# Patient Record
Sex: Male | Born: 1935 | Race: Black or African American | Hispanic: No | Marital: Married | Smoking: Former smoker
Health system: Southern US, Community
[De-identification: ages and names within clinical notes are randomized; demographics above are authoritative.]

## PROBLEM LIST (undated history)

## (undated) DIAGNOSIS — C61 Malignant neoplasm of prostate: Secondary | ICD-10-CM

## (undated) DIAGNOSIS — I719 Aortic aneurysm of unspecified site, without rupture: Secondary | ICD-10-CM

## (undated) DIAGNOSIS — E119 Type 2 diabetes mellitus without complications: Secondary | ICD-10-CM

## (undated) DIAGNOSIS — I509 Heart failure, unspecified: Secondary | ICD-10-CM

## (undated) DIAGNOSIS — N289 Disorder of kidney and ureter, unspecified: Secondary | ICD-10-CM

## (undated) DIAGNOSIS — C3490 Malignant neoplasm of unspecified part of unspecified bronchus or lung: Secondary | ICD-10-CM

## (undated) DIAGNOSIS — J8410 Pulmonary fibrosis, unspecified: Secondary | ICD-10-CM

## (undated) DIAGNOSIS — I10 Essential (primary) hypertension: Secondary | ICD-10-CM

## (undated) DIAGNOSIS — J449 Chronic obstructive pulmonary disease, unspecified: Secondary | ICD-10-CM

## (undated) HISTORY — PX: BACK SURGERY: SHX140

## (undated) HISTORY — PX: PROSTATE SURGERY: SHX751

---

## 2006-07-30 ENCOUNTER — Ambulatory Visit: Payer: Self-pay | Admitting: Urology

## 2006-08-29 ENCOUNTER — Encounter: Payer: Self-pay | Admitting: Urology

## 2006-08-31 ENCOUNTER — Encounter: Payer: Self-pay | Admitting: Urology

## 2006-10-01 ENCOUNTER — Encounter: Payer: Self-pay | Admitting: Urology

## 2006-11-01 ENCOUNTER — Encounter: Payer: Self-pay | Admitting: Urology

## 2006-11-30 ENCOUNTER — Encounter: Payer: Self-pay | Admitting: Urology

## 2008-07-01 ENCOUNTER — Ambulatory Visit: Payer: Self-pay | Admitting: Oncology

## 2008-07-05 ENCOUNTER — Ambulatory Visit: Payer: Self-pay | Admitting: Oncology

## 2008-08-01 ENCOUNTER — Ambulatory Visit: Payer: Self-pay | Admitting: Oncology

## 2008-10-01 ENCOUNTER — Ambulatory Visit: Payer: Self-pay | Admitting: Oncology

## 2008-10-28 ENCOUNTER — Ambulatory Visit: Payer: Self-pay | Admitting: Oncology

## 2008-11-01 ENCOUNTER — Ambulatory Visit: Payer: Self-pay | Admitting: Oncology

## 2009-01-29 ENCOUNTER — Ambulatory Visit: Payer: Self-pay | Admitting: Oncology

## 2009-02-24 ENCOUNTER — Ambulatory Visit: Payer: Self-pay | Admitting: Oncology

## 2009-03-01 ENCOUNTER — Ambulatory Visit: Payer: Self-pay | Admitting: Oncology

## 2009-07-01 ENCOUNTER — Ambulatory Visit: Payer: Self-pay | Admitting: Oncology

## 2009-07-07 ENCOUNTER — Ambulatory Visit: Payer: Self-pay | Admitting: Oncology

## 2009-08-01 ENCOUNTER — Ambulatory Visit: Payer: Self-pay | Admitting: Oncology

## 2009-10-01 ENCOUNTER — Ambulatory Visit: Payer: Self-pay | Admitting: Oncology

## 2009-10-25 ENCOUNTER — Ambulatory Visit: Payer: Self-pay | Admitting: Oncology

## 2009-11-01 ENCOUNTER — Ambulatory Visit: Payer: Self-pay | Admitting: Oncology

## 2009-11-29 ENCOUNTER — Ambulatory Visit: Payer: Self-pay | Admitting: Oncology

## 2010-02-21 ENCOUNTER — Ambulatory Visit: Payer: Self-pay | Admitting: Oncology

## 2010-03-01 ENCOUNTER — Ambulatory Visit: Payer: Self-pay | Admitting: Oncology

## 2010-06-01 ENCOUNTER — Ambulatory Visit: Payer: Self-pay | Admitting: Oncology

## 2010-06-22 ENCOUNTER — Ambulatory Visit: Payer: Self-pay | Admitting: Oncology

## 2010-07-01 ENCOUNTER — Ambulatory Visit: Payer: Self-pay | Admitting: Oncology

## 2010-10-12 ENCOUNTER — Ambulatory Visit: Payer: Self-pay | Admitting: Oncology

## 2010-10-13 LAB — PSA

## 2010-11-01 ENCOUNTER — Ambulatory Visit: Payer: Self-pay | Admitting: Oncology

## 2011-03-28 ENCOUNTER — Ambulatory Visit: Payer: Self-pay | Admitting: Oncology

## 2011-04-01 ENCOUNTER — Ambulatory Visit: Payer: Self-pay | Admitting: Oncology

## 2011-07-27 ENCOUNTER — Ambulatory Visit: Payer: Self-pay | Admitting: Oncology

## 2011-08-02 ENCOUNTER — Ambulatory Visit: Payer: Self-pay | Admitting: Oncology

## 2011-09-01 ENCOUNTER — Ambulatory Visit: Payer: Self-pay | Admitting: Oncology

## 2012-01-31 ENCOUNTER — Ambulatory Visit: Payer: Self-pay | Admitting: Oncology

## 2012-01-31 LAB — BASIC METABOLIC PANEL
Anion Gap: 10 (ref 7–16)
Calcium, Total: 9.4 mg/dL (ref 8.5–10.1)
Chloride: 101 mmol/L (ref 98–107)
EGFR (African American): 57 — ABNORMAL LOW

## 2012-03-01 ENCOUNTER — Ambulatory Visit: Payer: Self-pay | Admitting: Oncology

## 2012-06-20 ENCOUNTER — Ambulatory Visit: Payer: Self-pay | Admitting: Oncology

## 2012-06-20 LAB — CREATININE, SERUM
Creatinine: 1.42 mg/dL — ABNORMAL HIGH (ref 0.60–1.30)
EGFR (Non-African Amer.): 48 — ABNORMAL LOW

## 2012-06-20 LAB — BUN: BUN: 14 mg/dL (ref 7–18)

## 2012-07-01 ENCOUNTER — Ambulatory Visit: Payer: Self-pay | Admitting: Oncology

## 2012-10-01 ENCOUNTER — Ambulatory Visit: Payer: Self-pay | Admitting: Hematology and Oncology

## 2012-10-24 LAB — BASIC METABOLIC PANEL
BUN: 17 mg/dL (ref 7–18)
Calcium, Total: 9 mg/dL (ref 8.5–10.1)
Chloride: 104 mmol/L (ref 98–107)
EGFR (Non-African Amer.): 45 — ABNORMAL LOW
Glucose: 81 mg/dL (ref 65–99)
Osmolality: 282 (ref 275–301)
Sodium: 141 mmol/L (ref 136–145)

## 2012-11-01 ENCOUNTER — Ambulatory Visit: Payer: Self-pay | Admitting: Hematology and Oncology

## 2012-11-29 ENCOUNTER — Ambulatory Visit: Payer: Self-pay | Admitting: Hematology and Oncology

## 2012-12-12 LAB — CBC CANCER CENTER
Basophil #: 0.1 x10 3/mm (ref 0.0–0.1)
Basophil %: 1.1 %
Eosinophil #: 0.3 x10 3/mm (ref 0.0–0.7)
HGB: 13.3 g/dL (ref 13.0–18.0)
Lymphocyte #: 1.6 x10 3/mm (ref 1.0–3.6)
Lymphocyte %: 24 %
MCH: 29.7 pg (ref 26.0–34.0)
Monocyte #: 0.4 x10 3/mm (ref 0.2–1.0)
Neutrophil #: 4.4 x10 3/mm (ref 1.4–6.5)
Neutrophil %: 65.3 %
RBC: 4.47 10*6/uL (ref 4.40–5.90)
RDW: 15.4 % — ABNORMAL HIGH (ref 11.5–14.5)
WBC: 6.8 x10 3/mm (ref 3.8–10.6)

## 2012-12-19 LAB — CBC CANCER CENTER
Basophil #: 0.1 x10 3/mm (ref 0.0–0.1)
Basophil %: 0.6 %
Eosinophil #: 0.3 x10 3/mm (ref 0.0–0.7)
Eosinophil %: 3.7 %
HGB: 13.7 g/dL (ref 13.0–18.0)
Lymphocyte #: 1.5 x10 3/mm (ref 1.0–3.6)
Lymphocyte %: 18.6 %
MCHC: 33.4 g/dL (ref 32.0–36.0)
MCV: 89 fL (ref 80–100)
Monocyte #: 0.5 x10 3/mm (ref 0.2–1.0)
Monocyte %: 6.7 %
Platelet: 120 x10 3/mm — ABNORMAL LOW (ref 150–440)
RBC: 4.64 10*6/uL (ref 4.40–5.90)

## 2012-12-26 LAB — CBC CANCER CENTER
Basophil #: 0.1 x10 3/mm (ref 0.0–0.1)
Basophil %: 0.9 %
Eosinophil #: 0.3 x10 3/mm (ref 0.0–0.7)
Eosinophil %: 4.2 %
HCT: 41.2 % (ref 40.0–52.0)
HGB: 13.7 g/dL (ref 13.0–18.0)
Lymphocyte #: 1.3 x10 3/mm (ref 1.0–3.6)
Lymphocyte %: 18.2 %
MCH: 29.4 pg (ref 26.0–34.0)
MCV: 89 fL (ref 80–100)
Monocyte %: 7.6 %
Neutrophil %: 69.1 %
Platelet: 126 x10 3/mm — ABNORMAL LOW (ref 150–440)
RBC: 4.66 10*6/uL (ref 4.40–5.90)
RDW: 15.7 % — ABNORMAL HIGH (ref 11.5–14.5)
WBC: 7.4 x10 3/mm (ref 3.8–10.6)

## 2012-12-30 ENCOUNTER — Ambulatory Visit: Payer: Self-pay | Admitting: Hematology and Oncology

## 2013-01-02 LAB — CBC CANCER CENTER
Basophil %: 0.6 %
Eosinophil #: 0.2 x10 3/mm (ref 0.0–0.7)
Eosinophil %: 2.7 %
HGB: 12.5 g/dL — ABNORMAL LOW (ref 13.0–18.0)
Lymphocyte #: 0.9 x10 3/mm — ABNORMAL LOW (ref 1.0–3.6)
Lymphocyte %: 12.2 %
MCHC: 32.8 g/dL (ref 32.0–36.0)
Monocyte %: 8.2 %
Neutrophil %: 76.3 %
Platelet: 139 x10 3/mm — ABNORMAL LOW (ref 150–440)
RBC: 4.29 10*6/uL — ABNORMAL LOW (ref 4.40–5.90)
RDW: 16 % — ABNORMAL HIGH (ref 11.5–14.5)

## 2013-01-09 LAB — CBC CANCER CENTER
Basophil #: 0.1 x10 3/mm (ref 0.0–0.1)
Basophil %: 0.8 %
Eosinophil %: 4.1 %
HCT: 38.1 % — ABNORMAL LOW (ref 40.0–52.0)
Lymphocyte #: 1 x10 3/mm (ref 1.0–3.6)
MCH: 29.3 pg (ref 26.0–34.0)
Monocyte %: 9.4 %
Neutrophil #: 4.7 x10 3/mm (ref 1.4–6.5)
Neutrophil %: 70.2 %
Platelet: 164 x10 3/mm (ref 150–440)
RBC: 4.31 10*6/uL — ABNORMAL LOW (ref 4.40–5.90)
RDW: 15.9 % — ABNORMAL HIGH (ref 11.5–14.5)

## 2013-01-23 LAB — CBC CANCER CENTER
Basophil #: 0.1 x10 3/mm (ref 0.0–0.1)
HCT: 39.2 % — ABNORMAL LOW (ref 40.0–52.0)
HGB: 13.2 g/dL (ref 13.0–18.0)
Lymphocyte %: 13.1 %
MCH: 29.2 pg (ref 26.0–34.0)
MCHC: 33.6 g/dL (ref 32.0–36.0)
MCV: 87 fL (ref 80–100)
Monocyte %: 9 %
Platelet: 164 x10 3/mm (ref 150–440)
RBC: 4.5 10*6/uL (ref 4.40–5.90)

## 2013-01-29 ENCOUNTER — Ambulatory Visit: Payer: Self-pay | Admitting: Hematology and Oncology

## 2013-03-03 ENCOUNTER — Ambulatory Visit: Payer: Self-pay | Admitting: Oncology

## 2013-03-13 LAB — CBC
HCT: 36 % — ABNORMAL LOW (ref 40.0–52.0)
HGB: 12.2 g/dL — ABNORMAL LOW (ref 13.0–18.0)
MCHC: 33.8 g/dL (ref 32.0–36.0)
MCV: 85 fL (ref 80–100)
Platelet: 200 10*3/uL (ref 150–440)
RDW: 15.2 % — ABNORMAL HIGH (ref 11.5–14.5)
WBC: 7.4 10*3/uL (ref 3.8–10.6)

## 2013-03-13 LAB — BASIC METABOLIC PANEL
Anion Gap: 7 (ref 7–16)
BUN: 15 mg/dL (ref 7–18)
Calcium, Total: 8.7 mg/dL (ref 8.5–10.1)
Creatinine: 1.29 mg/dL (ref 0.60–1.30)
EGFR (Non-African Amer.): 54 — ABNORMAL LOW

## 2013-03-13 LAB — TROPONIN I: Troponin-I: <0.02 ng/mL

## 2013-03-14 ENCOUNTER — Inpatient Hospital Stay: Payer: Self-pay | Admitting: Internal Medicine

## 2013-03-14 LAB — URINALYSIS, COMPLETE
Bilirubin,UR: NEGATIVE
Leukocyte Esterase: NEGATIVE
Ph: 6 (ref 4.5–8.0)
RBC,UR: 1 /HPF (ref 0–5)
Specific Gravity: 1.005 (ref 1.003–1.030)
Squamous Epithelial: NONE SEEN
WBC UR: 1 /HPF (ref 0–5)

## 2013-03-14 LAB — CK TOTAL AND CKMB (NOT AT ARMC)
CK, Total: 60 U/L (ref 35–232)
CK-MB: 0.6 ng/mL (ref 0.5–3.6)
CK-MB: 0.8 ng/mL (ref 0.5–3.6)

## 2013-03-14 LAB — TROPONIN I: Troponin-I: <0.02 ng/mL

## 2013-03-15 LAB — BASIC METABOLIC PANEL
Anion Gap: 10 (ref 7–16)
Chloride: 102 mmol/L (ref 98–107)
Co2: 25 mmol/L (ref 21–32)
Creatinine: 1.28 mg/dL (ref 0.60–1.30)
EGFR (African American): >60
EGFR (Non-African Amer.): 55 — ABNORMAL LOW
Osmolality: 288 (ref 275–301)
Potassium: 3.7 mmol/L (ref 3.5–5.1)
Sodium: 137 mmol/L (ref 136–145)

## 2013-03-15 LAB — LIPID PANEL
HDL Cholesterol: 55 mg/dL (ref 40–60)
Ldl Cholesterol, Calc: 125 mg/dL — ABNORMAL HIGH (ref 0–100)

## 2013-03-15 LAB — TSH: Thyroid Stimulating Horm: 0.199 u[IU]/mL — ABNORMAL LOW

## 2013-03-15 LAB — MAGNESIUM: Magnesium: 2.1 mg/dL

## 2013-03-15 LAB — URINALYSIS, COMPLETE
Bacteria: NONE SEEN
Bilirubin,UR: NEGATIVE
Glucose,UR: NEGATIVE mg/dL (ref 0–75)
Leukocyte Esterase: NEGATIVE
Ph: 6 (ref 4.5–8.0)
Protein: NEGATIVE
Specific Gravity: 1.012 (ref 1.003–1.030)
WBC UR: 1 /HPF (ref 0–5)

## 2013-03-16 LAB — BASIC METABOLIC PANEL
BUN: 24 mg/dL — ABNORMAL HIGH (ref 7–18)
Chloride: 102 mmol/L (ref 98–107)
Co2: 28 mmol/L (ref 21–32)
Creatinine: 1.13 mg/dL (ref 0.60–1.30)
Osmolality: 286 (ref 275–301)
Potassium: 3.6 mmol/L (ref 3.5–5.1)
Sodium: 137 mmol/L (ref 136–145)

## 2013-03-16 LAB — URINE CULTURE

## 2013-05-30 ENCOUNTER — Emergency Department: Payer: Self-pay | Admitting: Emergency Medicine

## 2013-05-30 LAB — URINALYSIS, COMPLETE
Bacteria: NONE SEEN
Bilirubin,UR: NEGATIVE
Glucose,UR: NEGATIVE mg/dL (ref 0–75)
Nitrite: NEGATIVE
Protein: 30
Specific Gravity: 1.058 (ref 1.003–1.030)
Squamous Epithelial: <1

## 2013-05-30 LAB — COMPREHENSIVE METABOLIC PANEL
Anion Gap: 7 (ref 7–16)
BUN: 18 mg/dL (ref 7–18)
Calcium, Total: 8.9 mg/dL (ref 8.5–10.1)
Co2: 26 mmol/L (ref 21–32)
EGFR (Non-African Amer.): 51 — ABNORMAL LOW
Osmolality: 281 (ref 275–301)
SGPT (ALT): 19 U/L (ref 12–78)
Total Protein: 8.3 g/dL — ABNORMAL HIGH (ref 6.4–8.2)

## 2013-05-30 LAB — CBC WITH DIFFERENTIAL/PLATELET
Basophil %: 0.8 %
Eosinophil #: 0.3 10*3/uL (ref 0.0–0.7)
HCT: 39.9 % — ABNORMAL LOW (ref 40.0–52.0)
HGB: 13.3 g/dL (ref 13.0–18.0)
MCH: 27.9 pg (ref 26.0–34.0)
Neutrophil %: 78.5 %
Platelet: 157 10*3/uL (ref 150–440)
RBC: 4.77 10*6/uL (ref 4.40–5.90)

## 2013-05-30 LAB — PRO B NATRIURETIC PEPTIDE: B-Type Natriuretic Peptide: 1429 pg/mL — ABNORMAL HIGH

## 2013-05-30 LAB — TROPONIN I: Troponin-I: <0.02 ng/mL

## 2013-05-30 LAB — LIPASE, BLOOD: Lipase: 76 U/L (ref 73–393)

## 2013-07-08 ENCOUNTER — Ambulatory Visit: Payer: Self-pay | Admitting: Oncology

## 2013-07-09 LAB — CBC CANCER CENTER
Basophil %: 1.2 %
Eosinophil #: 0.3 x10 3/mm (ref 0.0–0.7)
HCT: 40.3 % (ref 40.0–52.0)
HGB: 13.2 g/dL (ref 13.0–18.0)
Lymphocyte #: 1.4 x10 3/mm (ref 1.0–3.6)
Lymphocyte %: 16.5 %
MCH: 27.9 pg (ref 26.0–34.0)
MCHC: 32.7 g/dL (ref 32.0–36.0)
Monocyte %: 5.7 %
Neutrophil #: 5.9 x10 3/mm (ref 1.4–6.5)
Platelet: 172 x10 3/mm (ref 150–440)
RBC: 4.72 10*6/uL (ref 4.40–5.90)
WBC: 8.2 x10 3/mm (ref 3.8–10.6)

## 2013-07-09 LAB — BASIC METABOLIC PANEL
Anion Gap: 12 (ref 7–16)
Calcium, Total: 8.6 mg/dL (ref 8.5–10.1)
Chloride: 103 mmol/L (ref 98–107)
Co2: 27 mmol/L (ref 21–32)
Creatinine: 1.4 mg/dL — ABNORMAL HIGH (ref 0.60–1.30)
EGFR (Non-African Amer.): 49 — ABNORMAL LOW
Potassium: 3.5 mmol/L (ref 3.5–5.1)

## 2013-07-10 LAB — PSA: PSA: 0.1 ng/mL (ref 0.0–4.0)

## 2013-08-01 ENCOUNTER — Ambulatory Visit: Payer: Self-pay | Admitting: Oncology

## 2014-01-06 ENCOUNTER — Ambulatory Visit: Payer: Self-pay | Admitting: Radiation Oncology

## 2014-05-18 ENCOUNTER — Ambulatory Visit: Payer: Self-pay | Admitting: Oncology

## 2014-05-20 LAB — PSA: PSA: 0.3 ng/mL (ref 0.0–4.0)

## 2014-06-01 ENCOUNTER — Ambulatory Visit: Payer: Self-pay | Admitting: Oncology

## 2015-01-21 NOTE — Discharge Summary (Signed)
PATIENT NAME:  Gabriel Gregory, Gabriel Gregory MR#:  [id number] DATE OF BIRTH:  1936-08-13  DATE OF ADMISSION:  03/14/2013 DATE OF DISCHARGE:  03/16/2013  PRIMARY CARE PHYSICIAN: At [facility]:  1.  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  2.  Chronic obstructive pulmonary disease exacerbation.  3.  Tobacco abuse.  4.  Obesity and sleep apnea.  5.  Hypertension.  6.  Diabetes.  7.  Hyperlipidemia.   DISCHARGE MEDICATIONS: Include: Glipizide 5 mg once a day, metformin 1000 mg twice a day, B complex vitamin 1 tablet daily, potassium chloride 20 mEq 1/2 tablet daily, latanoprost ophthalmic solution 0.005% solution 1 drop right eye at bedtime, lisinopril 40 mg daily, furosemide 20 mg, 2 tablets in the a.m. and 1 tablet in the afternoon, timolol ophthalmic drop, 1 drop affected eye each day, metoprolol 50 mg twice a day, Zithromax 500 mg every 24 hours for 2 more doses, prednisone 5 mg 4 tablets day 1, 3 tablets day 2, 2 tablets day 3, 1 tablet days 4 and 5.    DIET: Low sodium, carbohydrate-controlled diet, regular consistency.   ACTIVITY: As tolerated.   FOLLOWUP: In 1 to 2 weeks at the [facility] in [place]: The patient was admitted 03/14/2013 and discharged 03/16/2013. Came in with cough and shortness of breath, was admitted with COPD exacerbation and heart failure, started on IV Lasix, IV steroids, Rocephin and Zithromax.   LABORATORY AND RADIOLOGICAL DATA: During the hospital course:  Included an EKG that showed sinus rhythm, occasional PVCs. Chest x-ray showed differential, could be pulmonary fibrosis, superimposed pulmonary edema; infectious or inflammatory infiltrate cannot be excluded. BNP 1128, glucose 110, BUN 15, creatinine 1.29, sodium 143, potassium 3.0, chloride 107, CO2 29, calcium 8.7. Liver function tests not done. White blood cell count 7.4, H and H 12.2 and 36.0, platelet count of 200.  Troponin negative. Urinalysis 1+ blood. Troponin remained negative. TSH 0.199, free T4 1.11, free T3 1.8, LDL 125, HDL 55, triglycerides 88.  CT scan of the chest without contrast showed atrophy of the left kidney, small bilateral pleural effusions with findings suggestive of interstitial edema superimposed on COPD.  May be some underlying chronic interstitial lung disease as well. Scattered areas of fibrosis. Urine culture negative. Creatinine upon discharge 1.13, potassium 3.6. Echocardiogram showed an EF of 40 to 45%, moderately dilated left atrium, moderately dilated right atrium, moderate mitral valve regurgitation, moderately elevated pulmonary arterial systolic pressure.   HOSPITAL COURSE PER PROBLEM LIST:  1.  Acute combined systolic and diastolic congestive heart failure with moderate mitral regurgitation: The patient was diuresed initially with Lasix 20 mg IV b.i.d. I did switch him over to oral Lasix 40 mg daily, but he was taking 40 at home, so I figured I would go 40 in the morning and 20 the evening orally. I kept his lisinopril at 40 mg.  I cut back his metoprolol to 50 mg twice a day. Blood pressure in the morning 125/75, upon discharge had one at 142/72. Can consider adding Aldactone or even hydralazine and Imdur as outpatient if this still remains a problem. Close clinical follow-up as outpatient needed. Daily weights and monitoring needed by patient.  This was explained in detail.  2.  Chronic obstructive pulmonary disease exacerbation:   The patient was given IV antibiotics and IV steroids. The patient was switched over to oral Zithromax upon discharge and a prednisone taper. I think the  major issue here is the patient is a smoker and smoking cessation needed.  3.  Tobacco abuse:  Smoking cessation counseling done 3 minutes by me. The patient would go cold [place] on smoking cessation.  4.  Obesity and sleep apnea: Must wear CPAP at night, weight loss needed.  5.  Hypertension:  Blood  pressure stable upon discharge.  6:  Diabetes:  Sugars high with the steroids, can restart the Glucophage as outpatient.  7.  Hyperlipidemia: The patient does have an allergy to the statin class with Zocor, so I did not prescribe any statins. Can consider as outpatient.         TIME SPENT ON DISCHARGE:  35 minutes.   ____________________________ Tana Conch. Leslye Peer, MD rjw:cb D: 03/16/2013 16:14:48 ET T: 03/16/2013 23:44:03 ET JOB#: [id number]  cc: Tana Conch. Leslye Peer, MD, <Dictator> [place] SIGNED 03/19/2013 13:14

## 2015-01-21 NOTE — H&P (Signed)
PATIENT NAME:  Gabriel Gregory, Gabriel Gregory MR#:  [id number] DATE OF BIRTH:  [birth year]  DATE OF ADMISSION:  03/14/2013  PRIMARY CARE PHYSICIAN: At [facility] PHYSICIAN: Pollie Friar, MD   CHIEF COMPLAINT: Cough, shortness of breath.   HISTORY OF PRESENT ILLNESS: Gabriel Gregory is a 79 year old African-American male with past medical history of hypertension, hyperlipidemia, diabetes mellitus, continued tobacco use, who presented to the Emergency Department with complaints of shortness of breath. The patient has been having cough with productive yellowish sputum. Has been experiencing subjective fevers. For the last 2 days, the patient was unable to lie down flat, sitting in the recliner, leaning forward. Has been having dyspnea with mild exertion. The patient was able to walk around the house without shortness of breath; however, this is a significant change from his baseline. Concerning this, the patient is brought to the Emergency Department. Initially, the patient was diffusely wheezing. Received multiple breathing treatments. The patient also noted to have increased swelling in the lower extremities. Experienced chest pain yesterday, substernal pressure-like pain, no radiation, associated with worsening of the shortness of breath. Workup in the Emergency Department: Troponin is negative. BNP is 1120.   PAST MEDICAL HISTORY:  1. Hypertension.  2. Hyperlipidemia.  3. Diabetes mellitus.  4. Continued tobacco use.  5. History of prostate cancer.  6. Debility.  ALLERGIES:  1. FELODIPINE.  2. SIMVASTATIN.   HOME MEDICATIONS:  1. Timolol ophthalmic drops once a day.  2. Rosuvastatin 20 mg half-tablet once a day at bedtime.  3. Metoprolol 100 mg 1 tablet 2 times a day.  4. Metformin 1000 mg 2 times a day.  5. Lisinopril/hydrochlorothiazide 25/20 mg 1 tablet once a day.  6. Glipizide 5 mg once a day.  7. B complex vitamin once a day.  8. Aspirin 325 mg once a day.  9. Alendronate 70 mg  once a day.   SOCIAL HISTORY: Continues to smoke 1 to 1-1/2 packs a day. Denies drinking alcohol or using illicit drugs. Married. Lives with his wife. Retired from [employer].   FAMILY HISTORY: Hypertension, sister with congestive heart failure.   REVIEW OF SYSTEMS:  CONSTITUTIONAL: Generalized weakness, fatigue, increased weight gain.  EYES: No change in vision.  EARS: No change in hearing. No tinnitus. No sore throat.  RESPIRATORY: Has cough, shortness of breath.  CARDIOVASCULAR: Had chest pain yesterday, increased swelling in the lower extremities.  GASTROINTESTINAL: No nausea, vomiting, abdominal pain.  GENITOURINARY: No dysuria or hematuria.  HEMATOLOGIC: No easy bruising or bleeding.  SKIN: No rash or lesions.  ENDOCRINE: No polyuria or polydipsia.  MUSCULOSKELETAL: No joint pains and aches.  NEUROLOGIC: No weakness or numbness in any part of the body.   PHYSICAL EXAMINATION:  GENERAL: This is a well-built, well-nourished obese male lying down in the bed, not in distress.  VITAL SIGNS: Temperature 98.1, pulse 77, blood pressure 160/74, respiratory rate of 26, oxygen saturation 100% on 2 liters of oxygen.  HEENT: Head normocephalic, atraumatic. Eyes: No sclerae icterus. Conjunctivae normal. Pupils equal and reactive to light. Mucous membranes moist. Extraocular movements are intact. No pharyngeal erythema.  NECK: Supple. No lymphadenopathy. No JVD. No carotid bruit. No thyromegaly.  CHEST: Has no focal tenderness. Bilaterally diffuse wheezing and bibasilar crackles are heard.  HEART: S1, S2 regular. No murmurs are heard.  ABDOMEN: Bowel sounds present. Soft, nontender, nondistended. Could not palpate hepatosplenomegaly as the patient is obese.  LOWER EXTREMITIES: 2 to 3+ pitting edema extending up to the knees.  NEUROLOGIC: The patient is alert, oriented to place, person and time. Cranial nerves II through XII intact. No motor and sensory deficits.   LABORATORY DATA: CBC is  completely within normal limits. CMP is completely within normal limits. BNP 1128. UA negative for nitrites and leukocyte esterase.   ASSESSMENT AND PLAN: Gabriel Gregory is a 79 year old male who comes to the Emergency Department with cough and shortness of breath.   1. Chronic obstructive pulmonary disease exacerbation. Continue with the breathing treatments. Keep the patient on Rocephin and Zithromax considering the patient's discolored productive sputum. Keep the patient on Solu-Medrol.  2. Congestive heart failure, most likely diastolic; however, considering the patient having chest pain yesterday, will obtain echocardiogram. Keep fluid restriction. Daily weights. Strict I's and O's. Keep the patient on Lasix b.i.d.  3. Chest pain. Initial set of cardiac enzymes is negative; however, the patient is at very high risk for possible underlying coronary artery disease considering the patient's hypertension, diabetes mellitus and continued tobacco use as well as age. The patient will benefit getting a stress test done prior to discharge.  4. Continued tobacco use. Counseled with the patient regarding quitting. The patient seems to have poor understanding.  5. Hypertension, moderately controlled. Continue the current medications.  6. Diabetes mellitus. Will hold the metformin during the hospital stay. Keep the patient on sliding scale insulin.  7. Keep the patient on deep vein thrombosis prophylaxis with Lovenox.   TIME SPENT: 50 minutes.   ____________________________ Monica Becton, MD pv:OSi D: 03/14/2013 07:04:12 ET T: 03/14/2013 07:57:23 ET JOB#: [id number]  cc: Monica Becton, MD, <Dictator> Monica Becton MD ELECTRONICALLY SIGNED 03/15/2013 8:09

## 2015-01-21 NOTE — Consult Note (Signed)
Reason for Visit: This 79 year old Male patient presents to the clinic for initial evaluation of  prostate cancer .   Referred by Dr. [name].  Diagnosis:  Chief Complaint/Diagnosis   79 year old male status post radical prostatectomy approximately 20 years prior now with biochemical failure and hormone refractory disease with rising PSA despite Lupron therapy  Pathology Report pathology report from her original prostatectomy has not available.   Imaging Report bone scan is reviewed   Referral Report clinical notes reviewed   Planned Treatment Regimen adjuvant radiation therapy to prostatic fossa and pelvic nodes   HPI   patient is a 79 year old male underwent radical prostatectomy approximately 20 years prior to Dr. Maryan Puls with unknown Gleason score unknown staging at that time based on lack of availability of his pathology report. He is been treated since at least 2011 by Dr. [name] for rising PSA. He is been on hormone suppression therapy with Lupron. Has had chronic back pain MRI scan back in 2011 showing herniated disc. His PSAs continue to climb recently evidence of hormone refractory disease. Repeat bone scan shows no evidence to suggest metastatic disease to his axial skeleton. Patient has been incontinent since his prostatectomy. Is having no other GI or GU symptoms at this time. Still complains of some lower back pain. I've asked to evaluate the patient for possible options regarding radiation therapy. This  Past Hx:    Diabetes:    Prostate Ca:   Past, Family and Social History:  Past Medical History positive   Cardiovascular hyperlipidemia; hypertension   Endocrine diabetes mellitus   Past Medical History Comments degenerative disc disease   Family History noncontributory   Social History positive   Social History Comments greater than 50-pack-year smoking history, no EtOH use history   Additional Past Medical and Surgical History accompanied by his  wife today.   Allergies:   Felodipine: Swelling  Simvastatin: Other  Home Meds:  Home Medications: Medication Instructions Status  alendronate 70 mg oral tablet 1 tab(s) orally once a week x 28 days Active  B Complex Vitamin 1   once a day  Active  aspirin 325 1   once a day  Active  timol 0.5 % 1 gtt OU  once a day  Active  rosuvastatin 20 mg oral tablet 1/2 tab  orally once a day at bedtime Active  metformin 1000 mg oral tablet, extended release 1  orally 2 times a day  Active  metoprolol 100 mg oral tablet 1  orally 2 times a day  Active  hydrochlorothiazide-lisinopril 25 mg-20 mg oral tablet 1  orally once a day  Active  glipiZIDE 5 mg oral tablet 1  orally once a day  Active   Review of Systems:  General negative   Performance Status (ECOG) 0   Skin negative   Breast negative   Ophthalmologic negative   ENMT negative   Respiratory and Thorax negative   Cardiovascular negative   Gastrointestinal negative   Genitourinary see HPI   Musculoskeletal negative   Neurological negative   Psychiatric negative   Hematology/Lymphatics negative   Endocrine negative   Allergic/Immunologic negative   Review of Systems   according to the nurses notesPatient denies any weight loss, fatigue, weakness, fever, chills or night sweats. Patient denies any loss of vision, blurred vision. Patient denies any ringing  of the ears or hearing loss. No irregular heartbeat. Patient denies heart murmur or history of fainting. Patient denies any chest pain or pain radiating  to her upper extremities. Patient denies any shortness of breath, difficulty breathing at night, cough or hemoptysis. Patient denies any swelling in the lower legs. Patient denies any nausea vomiting, vomiting of blood, or coffee ground material in the vomitus. Patient denies any stomach pain. Patient states has had normal bowel movements no significant constipation or diarrhea. Patient denies any dysuria, hematuria or  significant nocturia. Patient denies any problems walking, swelling in the joints or loss of balance. Patient denies any skin changes, loss of hair or loss of weight. Patient denies any excessive worrying or anxiety or significant depression. Patient denies any problems with insomnia. Patient denies excessive thirst, polyuria, polydipsia. Patient denies any swollen glands, patient denies easy bruising or easy bleeding. Patient denies any recent infections, allergies or URI. Patient "s visual fields have not changed significantly in recent time.  Nursing Notes:  Nursing Vital Signs and Chemo Nursing Nursing Notes: *CC Vital Signs Flowsheet:   14-Feb-14 09:13  Pulse Pulse 76  Respirations Respirations 18  SBP SBP 158  DBP DBP 90    09:43  Temp Temperature 98.5  Pulse Pulse 76  Respirations Respirations 18  SBP SBP 158  DBP DBP 90  Current Weight (kg) (kg) 116.5   Physical Exam:  General/Skin/HEENT:  General normal   Skin normal   Eyes normal   ENMT normal   Head and Neck normal   Additional PE well-developed obese male in NAD. Lungs are clear to A&P cardiac examination shows regular rate and rhythm. Abdomen is obese with no organomegaly or masses noted. On rectal exam rectal sphincter tone is good. There is a prominence in the prostatic fossa consistent with recurrent disease measuring approximately 1.5 cm.   Breasts/Resp/CV/GI/GU:  Respiratory and Thorax normal   Cardiovascular normal   Gastrointestinal normal   Genitourinary normal   MS/Neuro/Psych/Lymph:  Musculoskeletal normal   Neurological normal   Lymphatics normal   Other Results:  Radiology Results: LabUnknown:    30-Jan-14 13:48, Bone Scan Whole Body (Part 2 of 2)  PACS Image   Nuclear Med:  Bone Scan Whole Body (Part 2 of 2)   REASON FOR EXAM:    prostate CA  rising PSA  COMMENTS:       PROCEDURE: KNM - KNM BONE WB 3HR 2 OF 2  - Oct 30 2012  1:48PM     RESULT: The patient has a history of  prostate malignancy and is being   evaluated for rising PSA level. The patient received 23.07 mCi of   technetium 64mlabeled MDP for this study.    There is adequate uptake of the radiopharmaceutical by the skeleton.   Adequate soft tissue clearance and renal activity is demonstrated.   Activity in the left kidney is decreased is compared to that on the   right. This decrease in activity is a new finding.    Uptake of the radiopharmaceutical within the calvarium is normal. There     is a focus of increased uptake inferiorly in the posterior elements of   the cervical spine to theleft of midline that is most likely   degenerative. Uptake within the upper thoracic spinous processes region   is is likely degenerative. There is a focus of increased uptake at   approximately L3 to the left of midline posteriorly that likely reflects   osteoarthritic change. Activity over the pectoral girdle, ribs, and   pelvis is within the limits of normal. Activity over the lower   extremities does not suggest metastatic  disease. Mild degenerative type   uptake in the medial aspect of the left knee and associated with the   midfoot and first metatarsophalangeal joint is likely degenerative.    IMPRESSION:   1. There are no findings to suggest metastatic disease.  2. There are degenerative findings as described.  3. Activity in the left kidney has decreased since the previous study.     Correlation with the functional status of the left kidney is needed.     Dictation Site: 2        Verified By: DAVID A. Martinique, M.D., MD   Relevent Results:   Relevant Scans and Labs one scan is reviewed.   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 Plan:   I believe based on his rising PSA and hormone refractory  nature of his disease we can still salvage the patient with external beam IMRT radiation therapy focusing on the prostatic fossa recurrence as well as his pelvic lymph nodes. Would try that treat up to 8000 cGy since we are dealing with gross disease in his prostatic fossa. We'll treat his pelvic lymph nodes to 5400cGy using IMRT does painting technique. Risks and benefits of treatment including possible diarrhea, dysuria, possible worsening of his incontinence, or explained in detail to the patient and his wife. His daughter who is a nurse was also listening in on our conversation via cell phone. I have set the patient up for CT simulation next week. discussed the case personally with Dr. [name].  I would like to take this opportunity to thank you for allowing me to continue to participate in this patient's care.  Electronic Signatures: Shylin Keizer, Roda Shutters (MD)  (Signed 14-Feb-14 11:46)  Authored: HPI, Diagnosis, Past Hx, PFSH, Allergies, Home Meds, ROS, Nursing Notes, Physical Exam, Other Results, Relevent Results, Encounter Assessment and Plan   Last Updated: 14-Feb-14 11:46 by Armstead Peaks (MD)

## 2015-04-05 ENCOUNTER — Emergency Department: Payer: Medicare Other

## 2015-04-05 ENCOUNTER — Other Ambulatory Visit: Payer: Self-pay

## 2015-04-05 ENCOUNTER — Emergency Department
Admission: EM | Admit: 2015-04-05 | Discharge: 2015-04-06 | Disposition: A | Discharge status: Other Acute Inpatient Hospital | Payer: Medicare Other | Attending: Emergency Medicine | Admitting: Emergency Medicine

## 2015-04-05 ENCOUNTER — Encounter: Payer: Self-pay | Admitting: Emergency Medicine

## 2015-04-05 DIAGNOSIS — I5043 Acute on chronic combined systolic (congestive) and diastolic (congestive) heart failure: Secondary | ICD-10-CM | POA: Diagnosis not present

## 2015-04-05 DIAGNOSIS — R0902 Hypoxemia: Secondary | ICD-10-CM | POA: Insufficient documentation

## 2015-04-05 DIAGNOSIS — Z79899 Other long term (current) drug therapy: Secondary | ICD-10-CM | POA: Insufficient documentation

## 2015-04-05 DIAGNOSIS — N3000 Acute cystitis without hematuria: Secondary | ICD-10-CM | POA: Diagnosis not present

## 2015-04-05 DIAGNOSIS — R410 Disorientation, unspecified: Secondary | ICD-10-CM

## 2015-04-05 DIAGNOSIS — R4182 Altered mental status, unspecified: Secondary | ICD-10-CM | POA: Diagnosis present

## 2015-04-05 DIAGNOSIS — I10 Essential (primary) hypertension: Secondary | ICD-10-CM | POA: Diagnosis not present

## 2015-04-05 DIAGNOSIS — Z7982 Long term (current) use of aspirin: Secondary | ICD-10-CM | POA: Insufficient documentation

## 2015-04-05 DIAGNOSIS — E119 Type 2 diabetes mellitus without complications: Secondary | ICD-10-CM | POA: Diagnosis not present

## 2015-04-05 DIAGNOSIS — Z87891 Personal history of nicotine dependence: Secondary | ICD-10-CM | POA: Diagnosis not present

## 2015-04-05 HISTORY — DX: Chronic obstructive pulmonary disease, unspecified: J44.9

## 2015-04-05 HISTORY — DX: Disorder of kidney and ureter, unspecified: N28.9

## 2015-04-05 HISTORY — DX: Malignant neoplasm of prostate: C61

## 2015-04-05 HISTORY — DX: Heart failure, unspecified: I50.9

## 2015-04-05 HISTORY — DX: Aortic aneurysm of unspecified site, without rupture: I71.9

## 2015-04-05 HISTORY — DX: Pulmonary fibrosis, unspecified: J84.10

## 2015-04-05 HISTORY — DX: Type 2 diabetes mellitus without complications: E11.9

## 2015-04-05 HISTORY — DX: Essential (primary) hypertension: I10

## 2015-04-05 LAB — CBC
HCT: 39.7 % — ABNORMAL LOW (ref 40.0–52.0)
Hemoglobin: 12.6 g/dL — ABNORMAL LOW (ref 13.0–18.0)
MCH: 27.5 pg (ref 26.0–34.0)
MCHC: 31.6 g/dL — ABNORMAL LOW (ref 32.0–36.0)
MCV: 87 fL (ref 80.0–100.0)
Platelets: 123 10*3/uL — ABNORMAL LOW (ref 150–440)
RBC: 4.56 MIL/uL (ref 4.40–5.90)
RDW: 16.1 % — ABNORMAL HIGH (ref 11.5–14.5)
WBC: 14.6 10*3/uL — AB (ref 3.8–10.6)

## 2015-04-05 LAB — URINALYSIS COMPLETE WITH MICROSCOPIC (ARMC ONLY)
Bilirubin Urine: NEGATIVE
Glucose, UA: NEGATIVE mg/dL
Ketones, ur: NEGATIVE mg/dL
NITRITE: NEGATIVE
PH: 9 — AB (ref 5.0–8.0)
Protein, ur: NEGATIVE mg/dL
Specific Gravity, Urine: 1.008 (ref 1.005–1.030)

## 2015-04-05 LAB — COMPREHENSIVE METABOLIC PANEL
ALT: 17 U/L (ref 17–63)
AST: 21 U/L (ref 15–41)
Albumin: 3.5 g/dL (ref 3.5–5.0)
Alkaline Phosphatase: 90 U/L (ref 38–126)
Anion gap: 8 (ref 5–15)
BILIRUBIN TOTAL: 1.5 mg/dL — AB (ref 0.3–1.2)
BUN: 18 mg/dL (ref 6–20)
CHLORIDE: 103 mmol/L (ref 101–111)
CO2: 27 mmol/L (ref 22–32)
CREATININE: 1.48 mg/dL — AB (ref 0.61–1.24)
Calcium: 8.5 mg/dL — ABNORMAL LOW (ref 8.9–10.3)
GFR calc Af Amer: 51 mL/min — ABNORMAL LOW (ref >60)
GFR, EST NON AFRICAN AMERICAN: 44 mL/min — AB (ref >60)
Glucose, Bld: 134 mg/dL — ABNORMAL HIGH (ref 65–99)
POTASSIUM: 3.8 mmol/L (ref 3.5–5.1)
Sodium: 138 mmol/L (ref 135–145)
Total Protein: 7.8 g/dL (ref 6.5–8.1)

## 2015-04-05 LAB — BRAIN NATRIURETIC PEPTIDE: B NATRIURETIC PEPTIDE 5: 825 pg/mL — AB (ref 0.0–100.0)

## 2015-04-05 LAB — TROPONIN I: Troponin I: <0.03 ng/mL (ref <0.031)

## 2015-04-05 LAB — LIPASE, BLOOD: Lipase: 31 U/L (ref 22–51)

## 2015-04-05 LAB — GLUCOSE, CAPILLARY: Glucose-Capillary: 121 mg/dL — ABNORMAL HIGH (ref 65–99)

## 2015-04-05 LAB — AMMONIA: AMMONIA: 10 umol/L (ref 9–35)

## 2015-04-05 MED ORDER — METHYLPREDNISOLONE SODIUM SUCC 125 MG IJ SOLR
INTRAMUSCULAR | Status: AC
  Administered 2015-04-05: 125 mg via INTRAVENOUS
  Filled 2015-04-05: qty 2

## 2015-04-05 MED ORDER — IPRATROPIUM-ALBUTEROL 0.5-2.5 (3) MG/3ML IN SOLN
3.0000 mL | Freq: Once | RESPIRATORY_TRACT | Status: AC
  Administered 2015-04-05: 3 mL via RESPIRATORY_TRACT

## 2015-04-05 MED ORDER — FUROSEMIDE 10 MG/ML IJ SOLN
INTRAMUSCULAR | Status: AC
  Administered 2015-04-05: 80 mg via INTRAVENOUS
  Filled 2015-04-05: qty 8

## 2015-04-05 MED ORDER — IPRATROPIUM-ALBUTEROL 0.5-2.5 (3) MG/3ML IN SOLN
RESPIRATORY_TRACT | Status: AC
  Administered 2015-04-05: 3 mL via RESPIRATORY_TRACT
  Filled 2015-04-05: qty 3

## 2015-04-05 MED ORDER — CEFTRIAXONE SODIUM IN DEXTROSE 20 MG/ML IV SOLN
1.0000 g | Freq: Once | INTRAVENOUS | Status: AC
  Administered 2015-04-05: 1 g via INTRAVENOUS

## 2015-04-05 MED ORDER — LORAZEPAM 2 MG/ML IJ SOLN
INTRAMUSCULAR | Status: AC
  Administered 2015-04-05: 0.5 mg via INTRAVENOUS
  Filled 2015-04-05: qty 1

## 2015-04-05 MED ORDER — CEFTRIAXONE SODIUM IN DEXTROSE 20 MG/ML IV SOLN
INTRAVENOUS | Status: AC
  Administered 2015-04-05: 1 g via INTRAVENOUS
  Filled 2015-04-05: qty 50

## 2015-04-05 MED ORDER — LORAZEPAM 2 MG/ML IJ SOLN
0.5000 mg | INTRAMUSCULAR | Status: AC
  Administered 2015-04-05: 0.5 mg via INTRAVENOUS

## 2015-04-05 MED ORDER — FUROSEMIDE 10 MG/ML IJ SOLN
80.0000 mg | Freq: Once | INTRAMUSCULAR | Status: AC
  Administered 2015-04-05: 80 mg via INTRAVENOUS

## 2015-04-05 MED ORDER — METHYLPREDNISOLONE SODIUM SUCC 125 MG IJ SOLR
125.0000 mg | INTRAMUSCULAR | Status: AC
  Administered 2015-04-05: 125 mg via INTRAVENOUS

## 2015-04-05 NOTE — ED Notes (Signed)
Patient daughter requesting to have patient lay back in bed and requesting to give patient something, patient daughter states "can you give him some ativan."

## 2015-04-05 NOTE — ED Notes (Addendum)
Patient present to ED with complaining of right abdominal pain, patient was found pinned between the closet door and entrance door. EMS vitals: BP 160/80 pulse 96 rr 24 CBG 131. Per EMS, family states patient ambulates and communicates. EMS states patient was feeling sick and was up at 4 am to use the bathroom, wife asked him if he was doing ok, patient responded he was ok. Patient unable to recognize daughters, per family patient is more confused today, patient has been laying in bed all day today, which family states is not normal for patient. Patient appears confused, unable to follow commands. MD at bedside.

## 2015-04-05 NOTE — ED Notes (Signed)
Per daughters request, RN attempting to find another IV site on patient. Daughter reports IV will irritate patient arm.

## 2015-04-05 NOTE — ED Notes (Signed)
Patient placed on bedpan to attempt to have BM, bed sheets changed, depend placed on patient.

## 2015-04-05 NOTE — ED Notes (Signed)
Attempted to call report to [facility], RN not available at this time.

## 2015-04-05 NOTE — ED Provider Notes (Signed)
[facility] Emergency Department Provider Note  ____________________________________________  Time seen: Approximately 8:29 PM  I have reviewed the triage vital signs and the nursing notes.   HISTORY  Chief Complaint Altered Mental Status    HPI Gabriel Gregory is a 79 y.o. male who attended physical therapy and drove himself there today. However, his grandson noticed that he was sleeping in bed thereafter most the day. His wife came home having last seen him normal at about 9 AM, and they said he was very confused and did not want to get up out of bed around 4 PM. The patient that at some point launched himself between a bathroom door and entry to his room, this is somewhat unclear, but EMS was called because of confusion and weakness.  Patient is unable to give any history. He is able to say that he doesn't hurt. He does know his name. Patient unable to answer anything other than very simple questions.  EM caveat patient unable to provide history or review of systems. Patient unable to participate in major areas of physical.  Past Medical History  Diagnosis Date  . COPD (chronic obstructive pulmonary disease)   . CHF (congestive heart failure)   . Diabetes mellitus without complication   . Hypertension   . Pulmonary fibrosis   . Renal disorder   . Prostate cancer   . Aortic aneurysm     There are no active problems to display for this patient.   Past Surgical History  Procedure Laterality Date  . Back surgery    . Prostate surgery      Current Outpatient Rx  Name  Route  Sig  Dispense  Refill  . albuterol (PROVENTIL HFA;VENTOLIN HFA) 108 (90 BASE) MCG/ACT inhaler   Inhalation   Inhale 1 puff into the lungs every 6 (six) hours as needed for wheezing or shortness of breath.         Marland Kitchen ammonium lactate (LAC-HYDRIN) 12 % lotion   Topical   Apply 1 application topically as needed for dry skin.         Marland Kitchen aspirin EC 325 MG tablet   Oral    Take 325 mg by mouth daily.         Marland Kitchen atorvastatin (LIPITOR) 40 MG tablet   Oral   Take 60 mg by mouth at bedtime.         . B Complex Vitamins (B COMPLEX PO)   Oral   Take 1 tablet by mouth daily.         . furosemide (LASIX) 40 MG tablet   Oral   Take 40 mg by mouth 2 (two) times daily.         Marland Kitchen glipiZIDE (GLUCOTROL) 5 MG tablet   Oral   Take 5 mg by mouth daily.         Marland Kitchen lisinopril (PRINIVIL,ZESTRIL) 40 MG tablet   Oral   Take 40 mg by mouth daily.         . metFORMIN (GLUCOPHAGE) 1000 MG tablet   Oral   Take 1,000 mg by mouth 2 (two) times daily.         . metoprolol (LOPRESSOR) 100 MG tablet   Oral   Take 100 mg by mouth 2 (two) times daily.         . potassium chloride SA (K-DUR,KLOR-CON) 20 MEQ tablet   Oral   Take 20 mEq by mouth daily.  Allergies Bee venom  No family history on file.  Social History History  Substance Use Topics  . Smoking status: Former Smoker    Quit date: 03/27/2015  . Smokeless tobacco: Not on file  . Alcohol Use: No    Review of Systems Unable to obtain. Patient's family states he was in normal state of health when he went to physical therapy this morning. He is laying in bed evidently all afternoon not getting up like he normally would, and now was found confused. He is not a fevers or chills. He does have an occasional cough. No overdoses, he is diabetic but only taking medications.  ____________________________________________   PHYSICAL EXAM:  VITAL SIGNS: ED Triage Vitals  Enc Vitals Group     BP 04/05/15 2016 144/54 mmHg     Pulse Rate 04/05/15 2016 105     Resp 04/05/15 2016 20     Temp 04/05/15 2016 97.7 F (36.5 C)     Temp src --      SpO2 04/05/15 2016 93 %     Weight 04/05/15 2016 238 lb (107.956 kg)     Height 04/05/15 2016 '5\' 8"'$  (1.727 m)     Head Cir --      Peak Flow --      Pain Score --      Pain Loc --      Pain Edu? --      Excl. in [place]? --     Constitutional:  Alert but somnolent appearing, he is oriented to himself but really is not oriented to anything else. He does not recognize his family Eyes: Conjunctivae are normal. PERRL. EOMI. Head: Atraumatic. Nose: No congestion/rhinnorhea. Mouth/Throat: Mucous membranes are moist.  Oropharynx non-erythematous. Neck: No stridor.   Cardiovascular: Normal rate, regular rhythm. Grossly normal heart sounds.  Good peripheral circulation. Respiratory: There is mild use of accessory muscles, there is an expiratory wheezing in the lower fields bilaterally. There is occasional dry cough. Gastrointestinal: Soft and nontender. No distention. Musculoskeletal: No lower extremity tenderness nor edema.  No joint effusions. Neurologic:  Patient is very somnolent. Speech is slightly thick, and he only speaks maybe 1-2 words at a time. He does seem to have some difficulty understanding, but also has difficulty following any commands. He does move all arms and legs, but all are very weak. There is no gross evidence of facial droop or cranial nerve deficit. Skin:  Skin is warm, dry and intact. No rash noted. Psychiatric: Somnolent.  ____________________________________________   LABS (all labs ordered are listed, but only abnormal results are displayed)  Labs Reviewed  CBC - Abnormal; Notable for the following:    WBC 14.6 (*)    Hemoglobin 12.6 (*)    HCT 39.7 (*)    MCHC 31.6 (*)    RDW 16.1 (*)    Platelets 123 (*)    All other components within normal limits  COMPREHENSIVE METABOLIC PANEL - Abnormal; Notable for the following:    Glucose, Bld 134 (*)    Creatinine, Ser 1.48 (*)    Calcium 8.5 (*)    Total Bilirubin 1.5 (*)    GFR calc non Af Amer 44 (*)    GFR calc Af Amer 51 (*)    All other components within normal limits  BRAIN NATRIURETIC PEPTIDE - Abnormal; Notable for the following:    B Natriuretic Peptide 825.0 (*)    All other components within normal limits  URINALYSIS COMPLETEWITH MICROSCOPIC  ([facility] ONLY) -  Abnormal; Notable for the following:    Color, Urine YELLOW (*)    APPearance HAZY (*)    Hgb urine dipstick 2+ (*)    pH 9.0 (*)    Leukocytes, UA 3+ (*)    Bacteria, UA RARE (*)    Squamous Epithelial / LPF 0-5 (*)    All other components within normal limits  BLOOD GAS, ARTERIAL - Abnormal; Notable for the following:    pH, Arterial 7.53 (*)    pCO2 arterial 31 (*)    Acid-Base Excess 3.7 (*)    Allens test (pass/fail) POSITIVE (*)    All other components within normal limits  GLUCOSE, CAPILLARY - Abnormal; Notable for the following:    Glucose-Capillary 121 (*)    All other components within normal limits  URINE CULTURE  LIPASE, BLOOD  TROPONIN I  AMMONIA  CBG MONITORING, ED   ____________________________________________  EKG Reviewed and interpreted by me Sinus rhythm with frequent PVCs. Possible LVH based on voltage in aVL. No acute ST abnormality, except for possible T-wave flattening or inversions in lateral precordial leads. There is no ST elevation.  PR 120, QRS 90, QTC 436   ____________________________________________  RADIOLOGY  CLINICAL DATA: Acute confusion  EXAM: CHEST 1 VIEW  COMPARISON: 05/30/2013  FINDINGS: Diffuse interstitial opacity. Lung volumes are low and there is chronic pulmonary fibrosis with honeycombing based on chest CT from 2014. Small pleural effusions, most convincing on the left. Borderline cardiomegaly. No acute aortic contour abnormality when accounting for rotation.  IMPRESSION: Pulmonary fibrosis with superimposed pulmonary edema.  CT of the head does not show acute abnormality. ____________________________________________   PROCEDURES  Procedure(s) performed: None  Critical Care performed: Yes, see critical care note(s)   CRITICAL CARE Performed by: Delman Kitten   Total critical care time: 40  Critical care time was exclusive of separately billable procedures and treating other  patients.  Critical care was necessary to treat or prevent imminent or life-threatening deterioration.  Critical care was time spent personally by me on the following activities: development of treatment plan with patient and/or surrogate as well as nursing, discussions with consultants, evaluation of patient's response to treatment, examination of patient, obtaining history from patient or surrogate, ordering and performing treatments and interventions, ordering and review of laboratory studies, ordering and review of radiographic studies, pulse oximetry and re-evaluation of patient's condition.  ----------------------------------------- 9:11 PM on 04/05/2015 -----------------------------------------   Patient with altered mental status, chest x-ray consistent with congestive heart failure. This leads me to suspect that he may be encephalopathic from being hypercarbic. I will initiate BiPAP and we will also obtain a blood gas. Currently awaiting other labs. Patient's daughter, who is a Marine scientist, also relates that they've been increasing his Lasix dose recently because of increased fluid retention. ____________________________________________   INITIAL IMPRESSION / ASSESSMENT AND PLAN / ED COURSE  Pertinent labs & imaging results that were available during my care of the patient were reviewed by me and considered in my medical decision making (see chart for details).  Patient presents with acute alteration of mental status. He was last seen normally approximately 9 AM this morning, then noted to stay in bed all day, and very confused this evening. There is no clear onset time that is reported at this time. I am certainly concerned about acute metabolic, cerebrovascular, cardiac, toxic, and other etiologies. At this time he does have some wheezing and mild increased work of breathing. No obtain an blood gas to evaluate for elevated CO2.  I'll give him 2 nebs and Solu-Medrol at this time obtain chest  x-ray, head CT, lab panel and reevaluate. Patient's family updated on plan.  ----------------------------------------- 10:18 PM on 04/05/2015 -----------------------------------------  Plan to admit the patient here, patient family requests transfer to [facility]. The patient is notably a [facility] patient, however the patient as family do not wish for him to be admitted to the [place]. They do request he be transferred to [facility], but his duties unable to accommodate at this time they would like him admitted here at [facility] regional. Family is very clear and the patient is clear that he does not wish to be admitted to the [facility] even though this may impact payment and continuity of care.  ----------------------------------------- 10:22 PM on 04/05/2015 -----------------------------------------  Patient reassessed at this time. He is awake and alert and his orientation is improved. His work of breathing does appear to be slowly improving. He appears to be stabilized. We are currently working to diuresis him. Based on his blood gases do not believe he would immediately benefit from BiPAP and his mental status is now much improved recognize his family and be able to converse. Patient's lungs are notably more clear. He is only using minimal work of breathing and speaking in phrases now, improved from previous.  Patient's excepted in transfer to [facility] with the accepting physician being Dr. Curlene Dolphin. We await bed assignment at this time.  ----------------------------------------- 11:15 PM on 04/05/2015 -----------------------------------------  Patient's urinalysis is indicative of urinary tract infection. Most all labs returned, but CBC is still pending (just called lab to inquire why delayed). Will obtain urine culture and initiate ceftriaxone here. Await transfer to [facility], accepted. Lab indicated that CBC result is hgb 12.6, WBC 14.6, plt  123.  ----------------------------------------- 12:15 AM on 04/06/2015 -----------------------------------------  I reassessed the patient at 11:45 PM during transfer EMS care. A report to paramedics [place]. The patient is alert, and oriented to self. Overall his clinical picture does appear to be improved. He has had ceftriaxone for urinary tract infection which is noted on UA which may also be contributing to his delirium. He remained stable at time of transfer with blood pressure 140s over 80s, respiratory rate 20-24 mild use of accessory muscles but in no distress, alerts to voice and oriented to self. He is able to recall that he is on his way to [facility] at this time. Overall his mental status is improving and his clinical picture appears to be improving. I feel he is stable for transfer via ALS to [facility]. I have called [facility] transfer center at this time to also alert them to the patient's urinary tract infection which showed up on his labs later.   ____________________________________________   FINAL CLINICAL IMPRESSION(S) / ED DIAGNOSES  Final diagnoses:  Acute on chronic combined systolic and diastolic congestive heart failure  Confusion  Hypoxia  Acute cystitis without hematuria      Delman Kitten, MD 04/06/15 0021

## 2015-04-05 NOTE — ED Notes (Signed)
Patent daughter insisting to have patient sit on side of bed, daughter made aware of potential fall risk for patient. Daughter requesting bed alarm and states she will be at bedside, despite being informed of potential harm. MD informed and charge RN notified.

## 2015-04-06 LAB — BLOOD GAS, ARTERIAL
ALLENS TEST (PASS/FAIL): POSITIVE — AB
Acid-Base Excess: 3.7 mmol/L — ABNORMAL HIGH (ref 0.0–3.0)
BICARBONATE: 25.9 meq/L (ref 21.0–28.0)
FIO2: 0.28 %
O2 SAT: 97.6 %
PCO2 ART: 31 mmHg — AB (ref 32.0–48.0)
PH ART: 7.53 — AB (ref 7.350–7.450)
Patient temperature: 37
pO2, Arterial: 87 mmHg (ref 83.0–108.0)

## 2015-05-19 ENCOUNTER — Other Ambulatory Visit: Payer: 59

## 2015-05-19 ENCOUNTER — Telehealth: Payer: Self-pay

## 2015-05-19 NOTE — Telephone Encounter (Signed)
Patient was suppose to f/u 6 months after his 07/2013 appt but did not keep that f/u.  He has history of back pain and has previously refused ortho referral but is overdue for f/u with Dr. [name] so please schedule.  Was coming for labs then seeing MD 2-3 days after see if they are willing to do appointment that way now.  Thanks.

## 2015-05-20 ENCOUNTER — Inpatient Hospital Stay: Payer: Medicare Other | Attending: Oncology

## 2015-05-20 ENCOUNTER — Other Ambulatory Visit: Payer: Self-pay | Admitting: *Deleted

## 2015-05-20 DIAGNOSIS — Z79899 Other long term (current) drug therapy: Secondary | ICD-10-CM | POA: Diagnosis not present

## 2015-05-20 DIAGNOSIS — M818 Other osteoporosis without current pathological fracture: Secondary | ICD-10-CM | POA: Insufficient documentation

## 2015-05-20 DIAGNOSIS — J8410 Pulmonary fibrosis, unspecified: Secondary | ICD-10-CM | POA: Diagnosis not present

## 2015-05-20 DIAGNOSIS — Z923 Personal history of irradiation: Secondary | ICD-10-CM | POA: Insufficient documentation

## 2015-05-20 DIAGNOSIS — R972 Elevated prostate specific antigen [PSA]: Secondary | ICD-10-CM | POA: Diagnosis not present

## 2015-05-20 DIAGNOSIS — Z87891 Personal history of nicotine dependence: Secondary | ICD-10-CM | POA: Insufficient documentation

## 2015-05-20 DIAGNOSIS — N289 Disorder of kidney and ureter, unspecified: Secondary | ICD-10-CM | POA: Insufficient documentation

## 2015-05-20 DIAGNOSIS — I10 Essential (primary) hypertension: Secondary | ICD-10-CM | POA: Insufficient documentation

## 2015-05-20 DIAGNOSIS — Z7982 Long term (current) use of aspirin: Secondary | ICD-10-CM | POA: Diagnosis not present

## 2015-05-20 DIAGNOSIS — C61 Malignant neoplasm of prostate: Secondary | ICD-10-CM | POA: Diagnosis present

## 2015-05-20 DIAGNOSIS — I509 Heart failure, unspecified: Secondary | ICD-10-CM | POA: Insufficient documentation

## 2015-05-20 DIAGNOSIS — J449 Chronic obstructive pulmonary disease, unspecified: Secondary | ICD-10-CM | POA: Diagnosis not present

## 2015-05-20 DIAGNOSIS — E119 Type 2 diabetes mellitus without complications: Secondary | ICD-10-CM | POA: Diagnosis not present

## 2015-05-20 DIAGNOSIS — Z8669 Personal history of other diseases of the nervous system and sense organs: Secondary | ICD-10-CM | POA: Diagnosis not present

## 2015-05-20 LAB — CBC WITH DIFFERENTIAL/PLATELET
Basophils Absolute: 0.1 10*3/uL (ref 0–0.1)
Basophils Relative: 1 %
EOS PCT: 3 %
Eosinophils Absolute: 0.2 10*3/uL (ref 0–0.7)
HEMATOCRIT: 36.3 % — AB (ref 40.0–52.0)
Hemoglobin: 11.8 g/dL — ABNORMAL LOW (ref 13.0–18.0)
LYMPHS PCT: 12 %
Lymphs Abs: 0.9 10*3/uL — ABNORMAL LOW (ref 1.0–3.6)
MCH: 27.1 pg (ref 26.0–34.0)
MCHC: 32.4 g/dL (ref 32.0–36.0)
MCV: 83.6 fL (ref 80.0–100.0)
MONOS PCT: 6 %
Monocytes Absolute: 0.5 10*3/uL (ref 0.2–1.0)
NEUTROS ABS: 6 10*3/uL (ref 1.4–6.5)
Neutrophils Relative %: 78 %
Platelets: 217 10*3/uL (ref 150–440)
RBC: 4.34 MIL/uL — ABNORMAL LOW (ref 4.40–5.90)
RDW: 17.2 % — ABNORMAL HIGH (ref 11.5–14.5)
WBC: 7.6 10*3/uL (ref 3.8–10.6)

## 2015-05-20 LAB — BASIC METABOLIC PANEL
Anion gap: 5 (ref 5–15)
BUN: 17 mg/dL (ref 6–20)
CO2: 24 mmol/L (ref 22–32)
CREATININE: 1.29 mg/dL — AB (ref 0.61–1.24)
Calcium: 7.9 mg/dL — ABNORMAL LOW (ref 8.9–10.3)
Chloride: 105 mmol/L (ref 101–111)
GFR calc Af Amer: >60 mL/min (ref >60)
GFR, EST NON AFRICAN AMERICAN: 52 mL/min — AB (ref >60)
GLUCOSE: 114 mg/dL — AB (ref 65–99)
Potassium: 3.3 mmol/L — ABNORMAL LOW (ref 3.5–5.1)
Sodium: 134 mmol/L — ABNORMAL LOW (ref 135–145)

## 2015-05-20 LAB — PSA: PSA: 8.68 ng/mL — ABNORMAL HIGH (ref 0.00–4.00)

## 2015-05-23 ENCOUNTER — Inpatient Hospital Stay (HOSPITAL_BASED_OUTPATIENT_CLINIC_OR_DEPARTMENT_OTHER): Payer: Medicare Other | Admitting: Oncology

## 2015-05-23 VITALS — BP 130/78 | Temp 97.4°F | Resp 20 | Wt 222.9 lb

## 2015-05-23 DIAGNOSIS — I509 Heart failure, unspecified: Secondary | ICD-10-CM

## 2015-05-23 DIAGNOSIS — C61 Malignant neoplasm of prostate: Secondary | ICD-10-CM

## 2015-05-23 DIAGNOSIS — R972 Elevated prostate specific antigen [PSA]: Secondary | ICD-10-CM

## 2015-05-23 DIAGNOSIS — I10 Essential (primary) hypertension: Secondary | ICD-10-CM

## 2015-05-23 DIAGNOSIS — J449 Chronic obstructive pulmonary disease, unspecified: Secondary | ICD-10-CM

## 2015-05-23 DIAGNOSIS — Z79899 Other long term (current) drug therapy: Secondary | ICD-10-CM | POA: Diagnosis not present

## 2015-05-23 DIAGNOSIS — Z7982 Long term (current) use of aspirin: Secondary | ICD-10-CM

## 2015-05-23 DIAGNOSIS — Z87891 Personal history of nicotine dependence: Secondary | ICD-10-CM

## 2015-05-23 DIAGNOSIS — M818 Other osteoporosis without current pathological fracture: Secondary | ICD-10-CM | POA: Diagnosis not present

## 2015-05-23 DIAGNOSIS — E119 Type 2 diabetes mellitus without complications: Secondary | ICD-10-CM

## 2015-05-23 DIAGNOSIS — Z8669 Personal history of other diseases of the nervous system and sense organs: Secondary | ICD-10-CM

## 2015-05-23 DIAGNOSIS — N289 Disorder of kidney and ureter, unspecified: Secondary | ICD-10-CM

## 2015-05-23 DIAGNOSIS — Z923 Personal history of irradiation: Secondary | ICD-10-CM

## 2015-05-23 DIAGNOSIS — J8410 Pulmonary fibrosis, unspecified: Secondary | ICD-10-CM

## 2015-05-27 NOTE — Progress Notes (Signed)
[place]  [phone] [phone]  ID: Claudie Fisherman OB: March 07, 1936  MR#: [id number]  IWP#:[id number]  Patient Care Team: No Pcp Per Patient as PCP - General (General Practice)  CHIEF COMPLAINT:  Chief Complaint  Patient presents with  . Follow-up    prostate cancer    INTERVAL HISTORY: Patient last evaluated in clinic in October 2014. He returns to clinic today for repeat laboratory work, further evaluation, and consideration of reinitiating Lupron. He currently feels well and is asymptomatic. He has no neurologic complaints. He denies any weight loss. He denies any bony pain. He has no chest pain or shortness of breath. He denies any nausea, vomiting, constipation, or diarrhea. He has no urinary complaints. Patient feels at his baseline and offers no specific complaints today.  REVIEW OF SYSTEMS:   Review of Systems  Constitutional: Negative.   Respiratory: Negative.   Cardiovascular: Negative.   Neurological: Negative.     As per HPI. Otherwise, a complete review of systems is negatve.  PAST MEDICAL HISTORY: Past Medical History  Diagnosis Date  . COPD (chronic obstructive pulmonary disease)   . CHF (congestive heart failure)   . Diabetes mellitus without complication   . Hypertension   . Pulmonary fibrosis   . Renal disorder   . Prostate cancer   . Aortic aneurysm     PAST SURGICAL HISTORY: Past Surgical History  Procedure Laterality Date  . Back surgery    . Prostate surgery      FAMILY HISTORY: reviewed and unchanged. No reported history of malignancy or chronic disease.     ADVANCED DIRECTIVES:    HEALTH MAINTENANCE: Social History  Substance Use Topics  . Smoking status: Former Smoker    Quit date: 03/27/2015  . Smokeless tobacco: Not on file  . Alcohol Use: No     Colonoscopy:  PAP:  Bone density:  Lipid panel:  Allergies  Allergen Reactions  . Bee Venom Hives and Shortness Of Breath     Current Outpatient Prescriptions  Medication Sig Dispense Refill  . albuterol (PROVENTIL HFA;VENTOLIN HFA) 108 (90 BASE) MCG/ACT inhaler Inhale 1 puff into the lungs every 6 (six) hours as needed for wheezing or shortness of breath.    Marland Kitchen ammonium lactate (LAC-HYDRIN) 12 % lotion Apply 1 application topically as needed for dry skin.    Marland Kitchen aspirin EC 325 MG tablet Take 325 mg by mouth daily.    Marland Kitchen atorvastatin (LIPITOR) 40 MG tablet Take 60 mg by mouth at bedtime.    . B Complex Vitamins (B COMPLEX PO) Take 1 tablet by mouth daily.    . furosemide (LASIX) 40 MG tablet Take 40 mg by mouth 2 (two) times daily.    Marland Kitchen glipiZIDE (GLUCOTROL) 5 MG tablet Take 5 mg by mouth daily.    Marland Kitchen lisinopril (PRINIVIL,ZESTRIL) 40 MG tablet Take 40 mg by mouth daily.    . metFORMIN (GLUCOPHAGE) 1000 MG tablet Take 1,000 mg by mouth 2 (two) times daily.    . metoprolol (LOPRESSOR) 100 MG tablet Take 100 mg by mouth 2 (two) times daily.    . potassium chloride SA (K-DUR,KLOR-CON) 20 MEQ tablet Take 20 mEq by mouth daily.     No current facility-administered medications for this visit.    OBJECTIVE: Filed Vitals:   05/23/15 1132  BP: 130/78  Temp: 97.4 F (36.3 C)  Resp: 20     Body mass index is 33.9 kg/(m^2).    ECOG FS:0 - Asymptomatic  General: Well-developed, well-nourished, no acute distress. Eyes: Pink conjunctiva, anicteric sclera. Lungs: Clear to auscultation bilaterally. Heart: Regular rate and rhythm. No rubs, murmurs, or gallops. Abdomen: Soft, nontender, nondistended. No organomegaly noted, normoactive bowel sounds. Musculoskeletal: No edema, cyanosis, or clubbing. Neuro: Alert, answering all questions appropriately. Cranial nerves grossly intact. Skin: No rashes or petechiae noted. Psych: Normal affect.   LAB RESULTS:  Lab Results  Component Value Date   NA 134* 05/20/2015   K 3.3* 05/20/2015   CL 105 05/20/2015   CO2 24 05/20/2015   GLUCOSE 114* 05/20/2015   BUN 17 05/20/2015    CREATININE 1.29* 05/20/2015   CALCIUM 7.9* 05/20/2015   PROT 7.8 04/05/2015   ALBUMIN 3.5 04/05/2015   AST 21 04/05/2015   ALT 17 04/05/2015   ALKPHOS 90 04/05/2015   BILITOT 1.5* 04/05/2015   GFRNONAA 52* 05/20/2015   GFRAA >60 05/20/2015    Lab Results  Component Value Date   WBC 7.6 05/20/2015   NEUTROABS 6.0 05/20/2015   HGB 11.8* 05/20/2015   HCT 36.3* 05/20/2015   MCV 83.6 05/20/2015   PLT 217 05/20/2015     STUDIES: No results found.  ASSESSMENT: Prostate cancer unknown Gleason score status post prostatectomy and prostatic bed XRT, now with increasing PSA.  PLAN:    1. Prostate cancer: Patient's PSA on May 18, 2014 was 0.3 and has now increased to 8.68. Previously, nuclear med bone scan was negative for disease, but will repeat in the next 1-2 weeks to assess for interval change. Patient last received Lupron on October 24, 2012.  He has agreed to reinitiate Lupron, but wishes to wait until September for his next injection. Return to clinic 6 months after his Lupron injection for repeat laboratory work and further evaluation. 2.   Osteoporosis: Bone mineral density in November 2012 revealed a T score of -2.8. Continue Fosamax, calcium, and vitamin D.  Approximately 30 minutes was spent in discussion and consultation.   Patient expressed understanding and was in agreement with this plan. He also understands that He can call clinic at any time with any questions, concerns, or complaints.    Lloyd Huger, MD   05/27/2015 2:03 PM

## 2015-06-13 ENCOUNTER — Inpatient Hospital Stay: Payer: Medicare Other | Attending: Oncology

## 2015-06-13 ENCOUNTER — Encounter: Admission: RE | Admit: 2015-06-13 | Payer: Medicare Other | Source: Ambulatory Visit

## 2015-06-13 DIAGNOSIS — Z87891 Personal history of nicotine dependence: Secondary | ICD-10-CM | POA: Insufficient documentation

## 2015-06-13 DIAGNOSIS — R531 Weakness: Secondary | ICD-10-CM | POA: Insufficient documentation

## 2015-06-13 DIAGNOSIS — C61 Malignant neoplasm of prostate: Secondary | ICD-10-CM | POA: Insufficient documentation

## 2015-06-13 DIAGNOSIS — Z5111 Encounter for antineoplastic chemotherapy: Secondary | ICD-10-CM | POA: Insufficient documentation

## 2015-06-13 DIAGNOSIS — R339 Retention of urine, unspecified: Secondary | ICD-10-CM | POA: Insufficient documentation

## 2015-06-13 DIAGNOSIS — M818 Other osteoporosis without current pathological fracture: Secondary | ICD-10-CM | POA: Insufficient documentation

## 2015-06-13 DIAGNOSIS — I509 Heart failure, unspecified: Secondary | ICD-10-CM | POA: Insufficient documentation

## 2015-06-13 DIAGNOSIS — Z8673 Personal history of transient ischemic attack (TIA), and cerebral infarction without residual deficits: Secondary | ICD-10-CM | POA: Insufficient documentation

## 2015-06-13 DIAGNOSIS — I10 Essential (primary) hypertension: Secondary | ICD-10-CM | POA: Insufficient documentation

## 2015-06-13 DIAGNOSIS — Z7982 Long term (current) use of aspirin: Secondary | ICD-10-CM | POA: Insufficient documentation

## 2015-06-13 DIAGNOSIS — R5383 Other fatigue: Secondary | ICD-10-CM | POA: Insufficient documentation

## 2015-06-13 DIAGNOSIS — J449 Chronic obstructive pulmonary disease, unspecified: Secondary | ICD-10-CM | POA: Insufficient documentation

## 2015-06-13 DIAGNOSIS — R0602 Shortness of breath: Secondary | ICD-10-CM | POA: Insufficient documentation

## 2015-06-13 DIAGNOSIS — N289 Disorder of kidney and ureter, unspecified: Secondary | ICD-10-CM | POA: Insufficient documentation

## 2015-06-13 DIAGNOSIS — Z79899 Other long term (current) drug therapy: Secondary | ICD-10-CM | POA: Insufficient documentation

## 2015-06-13 DIAGNOSIS — R918 Other nonspecific abnormal finding of lung field: Secondary | ICD-10-CM | POA: Insufficient documentation

## 2015-06-13 DIAGNOSIS — J8410 Pulmonary fibrosis, unspecified: Secondary | ICD-10-CM | POA: Insufficient documentation

## 2015-06-13 DIAGNOSIS — E119 Type 2 diabetes mellitus without complications: Secondary | ICD-10-CM | POA: Insufficient documentation

## 2015-06-15 ENCOUNTER — Emergency Department: Payer: Medicare Other

## 2015-06-15 ENCOUNTER — Encounter: Payer: Self-pay | Admitting: Emergency Medicine

## 2015-06-15 ENCOUNTER — Emergency Department
Admission: EM | Admit: 2015-06-15 | Discharge: 2015-06-15 | Disposition: A | Discharge status: Home / Self Care | Payer: Medicare Other | Attending: Emergency Medicine | Admitting: Emergency Medicine

## 2015-06-15 DIAGNOSIS — Z79899 Other long term (current) drug therapy: Secondary | ICD-10-CM | POA: Insufficient documentation

## 2015-06-15 DIAGNOSIS — E119 Type 2 diabetes mellitus without complications: Secondary | ICD-10-CM | POA: Diagnosis not present

## 2015-06-15 DIAGNOSIS — Z7982 Long term (current) use of aspirin: Secondary | ICD-10-CM | POA: Insufficient documentation

## 2015-06-15 DIAGNOSIS — R339 Retention of urine, unspecified: Secondary | ICD-10-CM | POA: Diagnosis not present

## 2015-06-15 DIAGNOSIS — I10 Essential (primary) hypertension: Secondary | ICD-10-CM | POA: Diagnosis not present

## 2015-06-15 DIAGNOSIS — R338 Other retention of urine: Secondary | ICD-10-CM

## 2015-06-15 DIAGNOSIS — J441 Chronic obstructive pulmonary disease with (acute) exacerbation: Secondary | ICD-10-CM | POA: Diagnosis not present

## 2015-06-15 DIAGNOSIS — Z87891 Personal history of nicotine dependence: Secondary | ICD-10-CM | POA: Diagnosis not present

## 2015-06-15 DIAGNOSIS — R0602 Shortness of breath: Secondary | ICD-10-CM | POA: Diagnosis present

## 2015-06-15 LAB — COMPREHENSIVE METABOLIC PANEL
ALT: 15 U/L — ABNORMAL LOW (ref 17–63)
AST: 26 U/L (ref 15–41)
Albumin: 3.7 g/dL (ref 3.5–5.0)
Alkaline Phosphatase: 91 U/L (ref 38–126)
Anion gap: 12 (ref 5–15)
BUN: 18 mg/dL (ref 6–20)
CALCIUM: 9 mg/dL (ref 8.9–10.3)
CO2: 22 mmol/L (ref 22–32)
Chloride: 101 mmol/L (ref 101–111)
Creatinine, Ser: 1.47 mg/dL — ABNORMAL HIGH (ref 0.61–1.24)
GFR calc non Af Amer: 44 mL/min — ABNORMAL LOW (ref >60)
GFR, EST AFRICAN AMERICAN: 51 mL/min — AB (ref >60)
Glucose, Bld: 182 mg/dL — ABNORMAL HIGH (ref 65–99)
Potassium: 3.4 mmol/L — ABNORMAL LOW (ref 3.5–5.1)
Sodium: 135 mmol/L (ref 135–145)
Total Bilirubin: 1.3 mg/dL — ABNORMAL HIGH (ref 0.3–1.2)
Total Protein: 8.3 g/dL — ABNORMAL HIGH (ref 6.5–8.1)

## 2015-06-15 LAB — URINALYSIS COMPLETE WITH MICROSCOPIC (ARMC ONLY)
BACTERIA UA: NONE SEEN
Bilirubin Urine: NEGATIVE
Glucose, UA: NEGATIVE mg/dL
Ketones, ur: NEGATIVE mg/dL
LEUKOCYTES UA: NEGATIVE
Nitrite: NEGATIVE
PH: 7 (ref 5.0–8.0)
Protein, ur: 30 mg/dL — AB
SQUAMOUS EPITHELIAL / LPF: NONE SEEN
Specific Gravity, Urine: 1.016 (ref 1.005–1.030)

## 2015-06-15 LAB — CBC WITH DIFFERENTIAL/PLATELET
BASOS ABS: 0.1 10*3/uL (ref 0–0.1)
BASOS PCT: 1 %
EOS ABS: 0.1 10*3/uL (ref 0–0.7)
Eosinophils Relative: 1 %
HEMATOCRIT: 47.8 % (ref 40.0–52.0)
Hemoglobin: 15.1 g/dL (ref 13.0–18.0)
Lymphocytes Relative: 12 %
Lymphs Abs: 1.2 10*3/uL (ref 1.0–3.6)
MCH: 27.3 pg (ref 26.0–34.0)
MCHC: 31.5 g/dL — ABNORMAL LOW (ref 32.0–36.0)
MCV: 86.6 fL (ref 80.0–100.0)
Monocytes Absolute: 0.5 10*3/uL (ref 0.2–1.0)
Monocytes Relative: 5 %
NEUTROS PCT: 81 %
Neutro Abs: 8.4 10*3/uL — ABNORMAL HIGH (ref 1.4–6.5)
Platelets: 145 10*3/uL — ABNORMAL LOW (ref 150–440)
RBC: 5.53 MIL/uL (ref 4.40–5.90)
RDW: 18.8 % — AB (ref 11.5–14.5)
WBC: 10.2 10*3/uL (ref 3.8–10.6)

## 2015-06-15 NOTE — ED Provider Notes (Signed)
[facility] Emergency Department Provider Note  Time seen: 6:54 PM  I have reviewed the triage vital signs and the nursing notes.   HISTORY  Chief Complaint Shortness of Breath and Urinary Retention    HPI Gabriel Gregory is a 79 y.o. male with a past medical history of COPD, CHF, diabetes, hypertension, pulmonary fibrosis, aortic aneurysm, presents the emergency department lower abdominal pain, trouble breathing, and being unable to urinate for the past 48 hours. According to the patient for the past 2 days he has not been able to urinate. He states progressively worsening lower abdominal distention and discomfort. Patient was still unable to urinate today so he came to the emergency department for evaluation. Patient also states moderate difficulty breathing feeling short of breath. Patient has a history of CHF, COPD as well as pulmonary fibrosis. Describes his abdominal discomfort is moderate to severe, located in the lower abdomen/suprapubic region. Patient states a history of urinary issues, he had his prostate removed per daughter at [facility].    Past Medical History  Diagnosis Date  . COPD (chronic obstructive pulmonary disease)   . CHF (congestive heart failure)   . Diabetes mellitus without complication   . Hypertension   . Pulmonary fibrosis   . Renal disorder   . Prostate cancer   . Aortic aneurysm     Patient Active Problem List   Diagnosis Date Noted  . Prostate cancer 05/23/2015    Past Surgical History  Procedure Laterality Date  . Back surgery    . Prostate surgery      Current Outpatient Rx  Name  Route  Sig  Dispense  Refill  . albuterol (PROVENTIL HFA;VENTOLIN HFA) 108 (90 BASE) MCG/ACT inhaler   Inhalation   Inhale 1 puff into the lungs every 6 (six) hours as needed for wheezing or shortness of breath.         Marland Kitchen ammonium lactate (LAC-HYDRIN) 12 % lotion   Topical   Apply 1 application topically as needed for dry skin.        Marland Kitchen aspirin EC 325 MG tablet   Oral   Take 325 mg by mouth daily.         Marland Kitchen atorvastatin (LIPITOR) 40 MG tablet   Oral   Take 60 mg by mouth at bedtime.         . B Complex Vitamins (B COMPLEX PO)   Oral   Take 1 tablet by mouth daily.         . furosemide (LASIX) 40 MG tablet   Oral   Take 40 mg by mouth 2 (two) times daily.         Marland Kitchen glipiZIDE (GLUCOTROL) 5 MG tablet   Oral   Take 5 mg by mouth daily.         Marland Kitchen lisinopril (PRINIVIL,ZESTRIL) 40 MG tablet   Oral   Take 40 mg by mouth daily.         . metFORMIN (GLUCOPHAGE) 1000 MG tablet   Oral   Take 1,000 mg by mouth 2 (two) times daily.         . metoprolol (LOPRESSOR) 100 MG tablet   Oral   Take 100 mg by mouth 2 (two) times daily.         . potassium chloride SA (K-DUR,KLOR-CON) 20 MEQ tablet   Oral   Take 20 mEq by mouth daily.           Allergies Bee venom and Simvastatin  No family history on file.  Social History Social History  Substance Use Topics  . Smoking status: Former Smoker    Quit date: 03/27/2015  . Smokeless tobacco: None  . Alcohol Use: No    Review of Systems Constitutional: Negative for fever. Cardiovascular: Negative for chest pain. Respiratory: Negative for shortness of breath. Gastrointestinal: Positive lower abdominal pain. Negative for nausea, vomiting, diarrhea. Genitourinary: Denies dysuria recently, but does state acute urinary retention for the past 2 days. Musculoskeletal: Negative for back pain Neurological: Negative for headache 10-point ROS otherwise negative.  ____________________________________________   PHYSICAL EXAM:  VITAL SIGNS: ED Triage Vitals  Enc Vitals Group     BP 06/15/15 1824 162/92 mmHg     Pulse Rate 06/15/15 1824 121     Resp 06/15/15 1824 28     Temp --      Temp src --      SpO2 06/15/15 1824 90 %     Weight 06/15/15 1824 232 lb (105.235 kg)     Height 06/15/15 1824 '5\' 6"'$  (1.676 m)     Head Cir --      Peak Flow  --      Pain Score 06/15/15 1825 9     Pain Loc --      Pain Edu? --      Excl. in [place]? --     Constitutional: Alert and oriented. Mild distress due to abdominal discomfort. ENT   Mouth/Throat: Mucous membranes are moist. Cardiovascular: Regular rhythm, rate around 110 bpm. Respiratory: Mild tachypnea, mild rhonchi bilaterally, unclear baseline. Gastrointestinal: Moderate suprapubic tenderness palpation, otherwise abdomen is nontender. No rebound or guarding. No distention. Obese. Genitourinary: Greater than 800 mL on bladder scan Musculoskeletal: Patient able to move all extremities without issue. Neurologic:  Normal speech and language. No gross focal neurologic deficits Skin:  Skin is warm, dry and intact.  Psychiatric: Mood and affect are normal. Speech and behavior are normal. ____________________________________________    EKG  EKG reviewed and interpreted by myself showing a regular but sinus rhythm most consistent with sinus arrhythmia 111 bpm, narrow QRS, normal axis, normal intervals, nonspecific ST changes are present. No ST elevations noted.  ____________________________________________    RADIOLOGY  Chest x-ray shows pulmonary fibrosis without any acute abnormality.  ____________________________________________   INITIAL IMPRESSION / ASSESSMENT AND PLAN / ED COURSE  Pertinent labs & imaging results that were available during my care of the patient were reviewed by me and considered in my medical decision making (see chart for details).  Patient with moderate lower abdominal tenderness palpation, greater than 800 mL's of urine on bladder scan. We will place a Foley catheter. Patient is tachypnea, we'll obtain labs and a chest x-ray. It is unclear if the shortness of breath is due to the abdominal discomfort, and we will monitor for resolution 12 urinary retention has resolved.  Patient is much more comfortable after Foley catheter has been placed. No discomfort  whatsoever. No trouble breathing. Labs and chest x-ray do not show any acute abnormality. Cultures were sent. Patient will be discharged home with a leg bag and catheter in place. He is to follow-up with urologist. He has a urologist at the [facility] he would like to follow up with. We will discharge home.  ____________________________________________   FINAL CLINICAL IMPRESSION(S) / ED DIAGNOSES  Acute urinary retention Dyspnea   Harvest Dark, MD 06/15/15 2048

## 2015-06-15 NOTE — ED Notes (Signed)
Pt to ED with c/o sob and urinary retention x 2 days, states he feels like he has a UTI, pt states he also was incontinent of stool while on the way to ED

## 2015-06-15 NOTE — Discharge Instructions (Signed)
Acute Urinary Retention °Acute urinary retention is the temporary inability to urinate. °This is a common problem in older men. As men age their prostates become larger and block the flow of urine from the bladder. This is usually a problem that has come on gradually.  °HOME CARE INSTRUCTIONS °If you are sent home with a Foley catheter and a drainage system, you will need to discuss the best course of action with your health care provider. While the catheter is in, maintain a good intake of fluids. Keep the drainage bag emptied and lower than your catheter. This is so that contaminated urine will not flow back into your bladder, which could lead to a urinary tract infection. °There are two main types of drainage bags. One is a large bag that usually is used at night. It has a good capacity that will allow you to sleep through the night without having to empty it. The second type is called a leg bag. It has a smaller capacity, so it needs to be emptied more frequently. However, the main advantage is that it can be attached by a leg strap and can go underneath your clothing, allowing you the freedom to move about or leave your home. °Only take over-the-counter or prescription medicines for pain, discomfort, or fever as directed by your health care provider.  °SEEK MEDICAL CARE IF: °· You develop a low-grade fever. °· You experience spasms or leakage of urine with the spasms. °SEEK IMMEDIATE MEDICAL CARE IF:  °· You develop chills or fever. °· Your catheter stops draining urine. °· Your catheter falls out. °· You start to develop increased bleeding that does not respond to rest and increased fluid intake. °MAKE SURE YOU: °· Understand these instructions. °· Will watch your condition. °· Will get help right away if you are not doing well or get worse. °Document Released: 12/24/2000 Document Revised: 09/22/2013 Document Reviewed: 02/26/2013 °[employer]® Patient Information ©2015 [employer]. This information is not  intended to replace advice given to you by your health care provider. Make sure you discuss any questions you have with your health care provider. ° °

## 2015-06-16 LAB — URINE CULTURE: CULTURE: NO GROWTH

## 2015-06-20 LAB — CULTURE, BLOOD (ROUTINE X 2)
Culture: NO GROWTH
Culture: NO GROWTH

## 2015-06-23 ENCOUNTER — Encounter: Payer: Self-pay | Admitting: Obstetrics and Gynecology

## 2015-06-23 ENCOUNTER — Ambulatory Visit (INDEPENDENT_AMBULATORY_CARE_PROVIDER_SITE_OTHER): Payer: Medicare Other | Admitting: Obstetrics and Gynecology

## 2015-06-23 ENCOUNTER — Ambulatory Visit: Payer: Medicare Other | Admitting: Radiation Oncology

## 2015-06-23 ENCOUNTER — Inpatient Hospital Stay: Payer: Medicare Other

## 2015-06-23 ENCOUNTER — Inpatient Hospital Stay: Payer: Medicare Other | Admitting: Oncology

## 2015-06-23 VITALS — BP 109/72 | HR 83 | Resp 16 | Ht 66.0 in | Wt 223.0 lb

## 2015-06-23 DIAGNOSIS — C61 Malignant neoplasm of prostate: Secondary | ICD-10-CM

## 2015-06-23 DIAGNOSIS — R339 Retention of urine, unspecified: Secondary | ICD-10-CM | POA: Diagnosis not present

## 2015-06-23 NOTE — Progress Notes (Signed)
Fill and Pull Catheter Removal  Patient is present today for a catheter removal.  Patient was cleaned and prepped in a sterile fashion 143m of sterile water/ saline was instilled into the bladder when the patient felt the urge to urinate. 875mof water was then drained from the balloon.  A 16FR foley cath was removed from the bladder no complications were noted .  Patient as then given some time to void on their own.  Patient cannot void  17064mn their own after some time.  Patient tolerated well.  Preformed by: SteGolden HurterMA  Follow up/ Additional notes: Patient had to leave the exam room for a bowl movement so out-put was not measured.  Patient was instructed to return by 1:30 PM at which time I will perform a PVR and teach CIC.  Continuous Intermittent Catheterization  Due to urine retention patient is present today for a teaching of self I & O Catheterization. Patient was given detailed verbal and printed instructions of self catheterization. Patient was cleaned and prepped in a sterile fashion.  With instruction and assistance patient inserted a 14FR and urine return was noted 150 ml, urine was light yellow in color. Patient tolerated well, complications were noted as: Pt. had difficulty inserting cath. while standing. He was given a leg mirror and was successful catheterizing himself while in a supine position with a leg mirror to guide himl. Patient was given a sample bag with supplies to take home.  Instructions were given per LinAlphonsa GinP for patient to cath 4 times daily.  An order was placed with [facility] for catheters to be sent to the patient's home. Patient is to follow up after a cystoscopy.  Preformed by: SteGolden HurterMA

## 2015-06-23 NOTE — Progress Notes (Signed)
06/23/2015 4:36 PM   Gabriel Gregory 01-04-1936 [id number]  Referring provider: Lloyd Huger, MD [address] [place], [place] [address]  Chief Complaint  Patient presents with  . Urinary Retention  . Establish Care    HPI: Patient is a 79 year old African-American male with a history of prostate cancer. Status post prostatectomy approximately 20 years ago by Dr. Yves Dill. Gleason score and staging unknown at that time based on lack of availability of pathology report. He has been treated since at least 2011 by Dr. [name] at [facility] for rising PSA. He has received ADT as well as salvage radiation in 2014. Last visit with Dr. [name] 05/27/15. Plan at that visit was to restart therapy with Lupron. Patient states that he has a follow-up appointment tomorrow.  He presents here today for follow-up after being seen in the emergency department 8 days ago for acute urinary retention. He states for 2 days prior to that visit he was experiencing increased difficulty urinating as well as suprapubic pain. Foley catheter was placed while he was in the emergency department with 800 mL residual. Patient's pain significantly improved and he was discharged with catheter in place.  Patient reports symptoms of frequency, urgency, nocturia, leakage, hesitancy, weak stream, and straining to urinate at baseline. He states that he did not notice a significant change in his urinary symptoms until 2 days prior to his episode of retention. He denies any changes in his medications. He does take a diuretic twice daily and occasionally increases his dosage depending on his level of fluid retention. No previous episodes of retention.  No change in urinary symptoms prior to event or change in medication.  ED Labs 06/15/15 UA clear Creatinine 1.47 (baseline appears to be 1.29-1.48) WBC 10.2   PMH: Past Medical History  Diagnosis Date  . COPD (chronic obstructive pulmonary disease)   . CHF  (congestive heart failure)   . Diabetes mellitus without complication   . Hypertension   . Pulmonary fibrosis   . Renal disorder   . Prostate cancer   . Aortic aneurysm     Surgical History: Past Surgical History  Procedure Laterality Date  . Back surgery    . Prostate surgery      Home Medications:    Medication List       This list is accurate as of: 06/23/15  4:36 PM.  Always use your most recent med list.               albuterol 108 (90 BASE) MCG/ACT inhaler  Commonly known as:  PROVENTIL HFA;VENTOLIN HFA  Inhale 1 puff into the lungs every 6 (six) hours as needed for wheezing or shortness of breath.     ammonium lactate 12 % lotion  Commonly known as:  LAC-HYDRIN  Apply 1 application topically as needed for dry skin.     aspirin EC 325 MG tablet  Take 325 mg by mouth daily.     atorvastatin 40 MG tablet  Commonly known as:  LIPITOR  Take 60 mg by mouth at bedtime.     B COMPLEX PO  Take 1 tablet by mouth daily.     BETIMOL 0.25 % ophthalmic solution  Generic drug:  timolol     ferrous sulfate 325 (65 FE) MG tablet  Take 325 mg by mouth daily with breakfast.     furosemide 40 MG tablet  Commonly known as:  LASIX  Take 40 mg by mouth 2 (two) times  daily.     glipiZIDE 5 MG tablet  Commonly known as:  GLUCOTROL  Take 5 mg by mouth daily.     latanoprost 0.005 % ophthalmic solution  Commonly known as:  XALATAN  Administer 1 drop to the right eye nightly.     lisinopril 40 MG tablet  Commonly known as:  PRINIVIL,ZESTRIL  Take 40 mg by mouth daily.     metFORMIN 1000 MG tablet  Commonly known as:  GLUCOPHAGE  Take 1,000 mg by mouth 2 (two) times daily.     metoprolol 100 MG tablet  Commonly known as:  LOPRESSOR  Take 100 mg by mouth 2 (two) times daily.     MULTI-VITAMINS Tabs  Take by mouth.     potassium chloride SA 20 MEQ tablet  Commonly known as:  K-DUR,KLOR-CON  Take 20 mEq by mouth daily.        Allergies:  Allergies   Allergen Reactions  . Bee Venom Hives and Shortness Of Breath  . Felodipine     Unknown reaction  . Simvastatin Other (See Comments)    unsure    Family History: Family History  Problem Relation Age of Onset  . Family history unknown: Yes    Social History:  reports that he quit smoking about 2 months ago. He does not have any smokeless tobacco history on file. He reports that he does not drink alcohol. His drug history is not on file.  ROS: UROLOGY Frequent Urination?: Yes Hard to postpone urination?: Yes Burning/pain with urination?: No Get up at night to urinate?: Yes Leakage of urine?: Yes Urine stream starts and stops?: No Trouble starting stream?: Yes Do you have to strain to urinate?: Yes Blood in urine?: No Urinary tract infection?: No Sexually transmitted disease?: No Injury to kidneys or bladder?: No Painful intercourse?: No Weak stream?: Yes Erection problems?: No Penile pain?: No  Gastrointestinal Nausea?: No Vomiting?: No Indigestion/heartburn?: No Diarrhea?: No Constipation?: No  Constitutional Fever: No Night sweats?: No Weight loss?: No Fatigue?: No  Skin Skin rash/lesions?: No Itching?: No  Eyes Blurred vision?: No Double vision?: No  Ears/Nose/Throat Sore throat?: No Sinus problems?: No  Hematologic/Lymphatic Swollen glands?: No Easy bruising?: No  Cardiovascular Leg swelling?: No Chest pain?: No  Respiratory Cough?: No Shortness of breath?: No  Endocrine Excessive thirst?: No  Musculoskeletal Back pain?: No Joint pain?: No  Neurological Headaches?: No Dizziness?: No  Psychologic Depression?: No Anxiety?: No  Physical Exam: BP 109/72 mmHg  Pulse 83  Resp 16  Ht '5\' 6"'$  (1.676 m)  Wt 223 lb (101.152 kg)  BMI 36.01 kg/m2  Constitutional:  Alert and oriented, No acute distress. HEENT: [place] AT, moist mucus membranes.  Trachea midline, no masses. Cardiovascular: No clubbing, cyanosis, or edema. Respiratory:  Normal respiratory effort, no increased work of breathing. GI: Abdomen is soft, nontender, nondistended, no abdominal masses GU: No CVA tenderness.  Skin: No rashes, bruises or suspicious lesions. Lymph: No cervical or inguinal adenopathy. Neurologic: Grossly intact, no focal deficits, moving all 4 extremities. Psychiatric: Normal mood and affect.  Laboratory Data:   Urinalysis    Component Value Date/Time   COLORURINE YELLOW* 06/15/2015 1849   COLORURINE Yellow 05/30/2013 1056   APPEARANCEUR CLEAR* 06/15/2015 1849   APPEARANCEUR Hazy 05/30/2013 1056   LABSPEC 1.016 06/15/2015 1849   LABSPEC 1.058 05/30/2013 1056   PHURINE 7.0 06/15/2015 1849   PHURINE 5.0 05/30/2013 1056   GLUCOSEU NEGATIVE 06/15/2015 1849   GLUCOSEU Negative 05/30/2013 1056   HGBUR  1+* 06/15/2015 1849   HGBUR 2+ 05/30/2013 1056   BILIRUBINUR NEGATIVE 06/15/2015 1849   BILIRUBINUR Negative 05/30/2013 [place] 06/15/2015 1849   KETONESUR Negative 05/30/2013 1056   PROTEINUR 30* 06/15/2015 1849   PROTEINUR 30 mg/dL 05/30/2013 1056   NITRITE NEGATIVE 06/15/2015 1849   NITRITE Negative 05/30/2013 [place] 06/15/2015 1849   LEUKOCYTESUR Negative 05/30/2013 1056    Pertinent Imaging:  Assessment & Plan:    1. Urinary retention- Voiding trial done today. Patient's urinary symptoms suspicious for possible bladder neck contracture. We will schedule him for cystoscopy for further evaluation. Patient will be instructed on CIC.  2. Prostate Cancer- Patient 20 years status post prostatectomy by Dr. Eliberto Ivory. Unknown Gleason score or initial staging. He is followed by the [facility] by Dr. [name]. He has also received salvage radiation by Dr. Donella Stade in 2014. Most recent PSA 8.68. He has an appointment with Dr. [name] tomorrow to restart Lupron therapy.  There are no diagnoses linked to this encounter.  Return for Nurse visit tomorrow for PVR; schedule  cystoscopy.  These notes generated with voice recognition software. I apologize for typographical errors.  Herbert Moors, [facility] [address] [place], [place] [address] [phone]

## 2015-06-24 ENCOUNTER — Ambulatory Visit: Payer: 59

## 2015-06-24 ENCOUNTER — Institutional Professional Consult (permissible substitution): Payer: Medicare Other | Admitting: Radiation Oncology

## 2015-06-24 ENCOUNTER — Ambulatory Visit: Payer: Medicare Other

## 2015-06-24 ENCOUNTER — Telehealth: Payer: Self-pay | Admitting: Radiology

## 2015-06-24 NOTE — Telephone Encounter (Signed)
Pt called stating he needs to r/s his PVR from 06/24/15 to 06/30/15. Pt states he has appts each day and his first availability will be 06/30/15. Notified nurses Toniann Fail and Golden Hurter.

## 2015-06-29 ENCOUNTER — Inpatient Hospital Stay (HOSPITAL_BASED_OUTPATIENT_CLINIC_OR_DEPARTMENT_OTHER): Payer: Medicare Other | Admitting: Oncology

## 2015-06-29 ENCOUNTER — Ambulatory Visit: Payer: Medicare Other

## 2015-06-29 ENCOUNTER — Encounter: Payer: Self-pay | Admitting: Radiation Oncology

## 2015-06-29 ENCOUNTER — Inpatient Hospital Stay: Payer: Medicare Other

## 2015-06-29 ENCOUNTER — Ambulatory Visit
Admission: RE | Admit: 2015-06-29 | Discharge: 2015-06-29 | Disposition: A | Discharge status: Home / Self Care | Payer: Medicare Other | Source: Ambulatory Visit | Attending: Radiation Oncology | Admitting: Radiation Oncology

## 2015-06-29 VITALS — BP 148/72 | HR 84 | Temp 97.0°F | Wt 223.4 lb

## 2015-06-29 DIAGNOSIS — R339 Retention of urine, unspecified: Secondary | ICD-10-CM

## 2015-06-29 DIAGNOSIS — E119 Type 2 diabetes mellitus without complications: Secondary | ICD-10-CM | POA: Diagnosis not present

## 2015-06-29 DIAGNOSIS — J449 Chronic obstructive pulmonary disease, unspecified: Secondary | ICD-10-CM | POA: Diagnosis not present

## 2015-06-29 DIAGNOSIS — R0602 Shortness of breath: Secondary | ICD-10-CM | POA: Diagnosis not present

## 2015-06-29 DIAGNOSIS — Z7982 Long term (current) use of aspirin: Secondary | ICD-10-CM

## 2015-06-29 DIAGNOSIS — Z8673 Personal history of transient ischemic attack (TIA), and cerebral infarction without residual deficits: Secondary | ICD-10-CM | POA: Diagnosis not present

## 2015-06-29 DIAGNOSIS — N289 Disorder of kidney and ureter, unspecified: Secondary | ICD-10-CM

## 2015-06-29 DIAGNOSIS — R918 Other nonspecific abnormal finding of lung field: Secondary | ICD-10-CM

## 2015-06-29 DIAGNOSIS — M818 Other osteoporosis without current pathological fracture: Secondary | ICD-10-CM | POA: Diagnosis not present

## 2015-06-29 DIAGNOSIS — C61 Malignant neoplasm of prostate: Secondary | ICD-10-CM

## 2015-06-29 DIAGNOSIS — Z87891 Personal history of nicotine dependence: Secondary | ICD-10-CM | POA: Diagnosis not present

## 2015-06-29 DIAGNOSIS — R5383 Other fatigue: Secondary | ICD-10-CM

## 2015-06-29 DIAGNOSIS — Z5111 Encounter for antineoplastic chemotherapy: Secondary | ICD-10-CM | POA: Diagnosis not present

## 2015-06-29 DIAGNOSIS — Z79899 Other long term (current) drug therapy: Secondary | ICD-10-CM

## 2015-06-29 DIAGNOSIS — Z85118 Personal history of other malignant neoplasm of bronchus and lung: Secondary | ICD-10-CM

## 2015-06-29 DIAGNOSIS — R531 Weakness: Secondary | ICD-10-CM | POA: Diagnosis not present

## 2015-06-29 DIAGNOSIS — I509 Heart failure, unspecified: Secondary | ICD-10-CM

## 2015-06-29 DIAGNOSIS — J8410 Pulmonary fibrosis, unspecified: Secondary | ICD-10-CM

## 2015-06-29 DIAGNOSIS — I10 Essential (primary) hypertension: Secondary | ICD-10-CM | POA: Diagnosis not present

## 2015-06-29 LAB — IRON AND TIBC
IRON: 37 ug/dL — AB (ref 45–182)
Saturation Ratios: 10 % — ABNORMAL LOW (ref 17.9–39.5)
TIBC: 377 ug/dL (ref 250–450)
UIBC: 340 ug/dL

## 2015-06-29 LAB — CBC WITH DIFFERENTIAL/PLATELET
Basophils Absolute: 0.1 10*3/uL (ref 0–0.1)
EOS ABS: 0.2 10*3/uL (ref 0–0.7)
Eosinophils Relative: 3 %
HEMATOCRIT: 39.7 % — AB (ref 40.0–52.0)
HEMOGLOBIN: 12.8 g/dL — AB (ref 13.0–18.0)
Lymphocytes Relative: 15 %
Lymphs Abs: 1 10*3/uL (ref 1.0–3.6)
MCH: 27.3 pg (ref 26.0–34.0)
MCHC: 32.2 g/dL (ref 32.0–36.0)
MCV: 84.7 fL (ref 80.0–100.0)
Monocytes Absolute: 0.5 10*3/uL (ref 0.2–1.0)
NEUTROS ABS: 5.4 10*3/uL (ref 1.4–6.5)
Platelets: 188 10*3/uL (ref 150–440)
RBC: 4.68 MIL/uL (ref 4.40–5.90)
RDW: 18.3 % — ABNORMAL HIGH (ref 11.5–14.5)
WBC: 7.2 10*3/uL (ref 3.8–10.6)

## 2015-06-29 LAB — TSH: TSH: 1.366 u[IU]/mL (ref 0.350–4.500)

## 2015-06-29 LAB — BASIC METABOLIC PANEL
ANION GAP: 8 (ref 5–15)
BUN: 22 mg/dL — AB (ref 6–20)
CHLORIDE: 102 mmol/L (ref 101–111)
CO2: 27 mmol/L (ref 22–32)
Calcium: 8.3 mg/dL — ABNORMAL LOW (ref 8.9–10.3)
Creatinine, Ser: 1.71 mg/dL — ABNORMAL HIGH (ref 0.61–1.24)
GFR, EST AFRICAN AMERICAN: 43 mL/min — AB (ref >60)
GFR, EST NON AFRICAN AMERICAN: 37 mL/min — AB (ref >60)
Glucose, Bld: 84 mg/dL (ref 65–99)
POTASSIUM: 3.3 mmol/L — AB (ref 3.5–5.1)
SODIUM: 137 mmol/L (ref 135–145)

## 2015-06-29 LAB — FERRITIN: Ferritin: 55 ng/mL (ref 24–336)

## 2015-06-29 MED ORDER — LEUPROLIDE ACETATE (6 MONTH) 45 MG IM KIT
45.0000 mg | PACK | Freq: Once | INTRAMUSCULAR | Status: AC
  Administered 2015-06-29: 45 mg via INTRAMUSCULAR
  Filled 2015-06-29: qty 45

## 2015-06-29 NOTE — Consult Note (Signed)
Except an outstanding is perfect of Radiation Oncology NEW PATIENT EVALUATION  Name: Gabriel Gregory  MRN: [id number]  Date:   06/29/2015     DOB: 04/22/1936   This 79 y.o. male patient presents to the clinic for initial evaluation of right lower lobe lung cancer.  REFERRING PHYSICIAN: Lloyd Huger, MD  CHIEF COMPLAINT:  Chief Complaint  Patient presents with  . Lung Cancer    initial evaluation of rad therapy    DIAGNOSIS: The encounter diagnosis was Personal history of malignant neoplasm of bronchus and lung.   PREVIOUS INVESTIGATIONS:  CT scans have been requested for my review Pathology report requested for my review Clinical notes requested for my review All from the [facility]  HPI: Patient is a 79 year old male well known to our department having been treated back in April 2016 with salvage radiation therapy status post radical prostatectomy 20 years prior then presented with biochemical failure and hormone refractory disease with rising PSA despite Lupron therapy. He has been followed by medical oncology which has reinstituted Lupron therapy recently for slightly rising PSA in the 8.6 range. He was seen to be a [facility] and I believe routine chest x-ray showed a right lower lobe lung mass. This was confirmed on CT scan patient according to his own history had a CT-guided biopsy which was positive for non-small cell lung cancer. I have no records CT scans or pathology report at this time for evaluation. That information is been requested for the past several weeks. He is feeling fatigued. Specifically denies cough hemoptysis or chest tightness. BA has mesh mentioned SB RT treatments for his early-stage lung cancer.  PLANNED TREATMENT REGIMEN: SB RT to right lower lobe  PAST MEDICAL HISTORY:  has a past medical history of COPD (chronic obstructive pulmonary disease); CHF (congestive heart failure); Diabetes mellitus without complication; Hypertension; Pulmonary  fibrosis; Renal disorder; Prostate cancer; and Aortic aneurysm.    PAST SURGICAL HISTORY:  Past Surgical History  Procedure Laterality Date  . Back surgery    . Prostate surgery      FAMILY HISTORY: Family history is unknown by patient.  SOCIAL HISTORY:  reports that he quit smoking about 3 months ago. He does not have any smokeless tobacco history on file. He reports that he does not drink alcohol.  ALLERGIES: Bee venom; Felodipine; and Simvastatin  MEDICATIONS:  Current Outpatient Prescriptions  Medication Sig Dispense Refill  . albuterol (PROVENTIL HFA;VENTOLIN HFA) 108 (90 BASE) MCG/ACT inhaler Inhale 1 puff into the lungs every 6 (six) hours as needed for wheezing or shortness of breath.    Marland Kitchen aspirin EC 325 MG tablet Take 325 mg by mouth daily.    Marland Kitchen atorvastatin (LIPITOR) 40 MG tablet Take 60 mg by mouth at bedtime.    . B Complex Vitamins (B COMPLEX PO) Take 1 tablet by mouth daily.    . ferrous sulfate 325 (65 FE) MG tablet Take 325 mg by mouth daily with breakfast.    . furosemide (LASIX) 40 MG tablet Take 40 mg by mouth 2 (two) times daily.    Marland Kitchen glipiZIDE (GLUCOTROL) 5 MG tablet Take 5 mg by mouth daily.    Marland Kitchen latanoprost (XALATAN) 0.005 % ophthalmic solution Administer 1 drop to the right eye nightly.    Marland Kitchen lisinopril (PRINIVIL,ZESTRIL) 40 MG tablet Take 40 mg by mouth daily.    . metFORMIN (GLUCOPHAGE) 1000 MG tablet Take 1,000 mg by mouth 2 (two) times daily.    . metoprolol (  LOPRESSOR) 100 MG tablet Take 100 mg by mouth 2 (two) times daily.    . Multiple Vitamin (MULTI-VITAMINS) TABS Take by mouth.    . potassium chloride SA (K-DUR,KLOR-CON) 20 MEQ tablet Take 20 mEq by mouth daily.    . timolol (BETIMOL) 0.25 % ophthalmic solution     . ammonium lactate (LAC-HYDRIN) 12 % lotion Apply 1 application topically as needed for dry skin.     No current facility-administered medications for this encounter.    ECOG PERFORMANCE STATUS:  0 - Asymptomatic  REVIEW OF SYSTEMS:  Except for increasing fatigue and some urinary symptoms including decreased stream Patient denies any weight loss, fatigue, weakness, fever, chills or night sweats. Patient denies any loss of vision, blurred vision. Patient denies any ringing  of the ears or hearing loss. No irregular heartbeat. Patient denies heart murmur or history of fainting. Patient denies any chest pain or pain radiating to her upper extremities. Patient denies any shortness of breath, difficulty breathing at night, cough or hemoptysis. Patient denies any swelling in the lower legs. Patient denies any nausea vomiting, vomiting of blood, or coffee ground material in the vomitus. Patient denies any stomach pain. Patient states has had normal bowel movements no significant constipation or diarrhea. Patient denies any dysuria, hematuria or significant nocturia. Patient denies any problems walking, swelling in the joints or loss of balance. Patient denies any skin changes, loss of hair or loss of weight. Patient denies any excessive worrying or anxiety or significant depression. Patient denies any problems with insomnia. Patient denies excessive thirst, polyuria, polydipsia. Patient denies any swollen glands, patient denies easy bruising or easy bleeding. Patient denies any recent infections, allergies or URI. Patient "s visual fields have not changed significantly in recent time.    PHYSICAL EXAM: BP 148/72 mmHg  Pulse 84  Temp(Src) 97 F (36.1 C)  Wt 223 lb 7 oz (101.35 kg) Well-developed well-nourished patient obese patient in NAD. HEENT reveals PERLA, EOMI, discs not visualized.  Oral cavity is clear. No oral mucosal lesions are identified. Neck is clear without evidence of cervical or supraclavicular adenopathy. Lungs are clear to A&P. Cardiac examination is essentially unremarkable with regular rate and rhythm without murmur rub or thrill. Abdomen is benign with no organomegaly or masses noted. Motor sensory and DTR levels are equal  and symmetric in the upper and lower extremities. Cranial nerves II through XII are grossly intact. Proprioception is intact. No peripheral adenopathy or edema is identified. No motor or sensory levels are noted. Crude visual fields are within normal range.  LABORATORY DATA: Pathology reports requested    RADIOLOGY RESULTS: CT scans requested   IMPRESSION: Probable early stage right lower lobe lung cancer amenable to SB RT radiation therapy  PLAN: At this time would like to obtain all his clinical records from the [facility] and up again requested both verbally and written for his CT scans pathology report and clinical notes. I've ordered a set up and ordered CT simulation in about a week's time to allow [place] an interval to obtain those records. Risks and benefits of SB RT including productive cough, fatigue, skin reaction, possible slight alteration of blood counts all were discussed in detail with the patient. He seems to comprehend my treatment plan well.  I would like to take this opportunity for allowing me to participate in the care of your patient.Armstead Peaks., MD

## 2015-06-30 ENCOUNTER — Ambulatory Visit: Payer: 59

## 2015-06-30 LAB — VITAMIN B12: Vitamin B-12: 369 pg/mL (ref 180–914)

## 2015-06-30 LAB — PSA: PSA: 11.58 ng/mL — AB (ref 0.00–4.00)

## 2015-07-01 LAB — T4: T4, Total: 8.4 ug/dL (ref 4.5–12.0)

## 2015-07-07 ENCOUNTER — Other Ambulatory Visit: Payer: 59 | Admitting: Urology

## 2015-07-07 ENCOUNTER — Encounter: Payer: Self-pay | Admitting: Urology

## 2015-07-10 NOTE — Progress Notes (Signed)
[place]  [phone] [phone]  ID: Gabriel Gregory OB: 10/03/1935  MR#: [id number]  ERD#:[id number]  Patient Care Team: Lloyd Huger, MD as PCP - General (Oncology)  CHIEF COMPLAINT:  No chief complaint on file.   INTERVAL HISTORY: Patient returns to clinic today for further evaluation and initiation of Lupron. He plans of increased weakness or fatigue, but otherwise feels well. He has no neurologic complaints. He denies any weight loss. He denies any bony pain. He has no chest pain or shortness of breath. He denies any nausea, vomiting, constipation, or diarrhea. He has no urinary complaints. Patient offers no further specific complaints today.  REVIEW OF SYSTEMS:   Review of Systems  Constitutional: Positive for malaise/fatigue.  Respiratory: Negative.   Cardiovascular: Negative.   Neurological: Positive for weakness.    As per HPI. Otherwise, a complete review of systems is negatve.  PAST MEDICAL HISTORY: Past Medical History  Diagnosis Date  . COPD (chronic obstructive pulmonary disease)   . CHF (congestive heart failure)   . Diabetes mellitus without complication   . Hypertension   . Pulmonary fibrosis   . Renal disorder   . Prostate cancer   . Aortic aneurysm     PAST SURGICAL HISTORY: Past Surgical History  Procedure Laterality Date  . Back surgery    . Prostate surgery      FAMILY HISTORY: reviewed and unchanged. No reported history of malignancy or chronic disease.     ADVANCED DIRECTIVES:    HEALTH MAINTENANCE: Social History  Substance Use Topics  . Smoking status: Former Smoker    Quit date: 03/27/2015  . Smokeless tobacco: Not on file  . Alcohol Use: No     Colonoscopy:  PAP:  Bone density:  Lipid panel:  Allergies  Allergen Reactions  . Bee Venom Hives and Shortness Of Breath  . Felodipine     Unknown reaction  . Simvastatin Other (See Comments)    unsure    Current Outpatient  Prescriptions  Medication Sig Dispense Refill  . albuterol (PROVENTIL HFA;VENTOLIN HFA) 108 (90 BASE) MCG/ACT inhaler Inhale 1 puff into the lungs every 6 (six) hours as needed for wheezing or shortness of breath.    Marland Kitchen ammonium lactate (LAC-HYDRIN) 12 % lotion Apply 1 application topically as needed for dry skin.    Marland Kitchen aspirin EC 325 MG tablet Take 325 mg by mouth daily.    Marland Kitchen atorvastatin (LIPITOR) 40 MG tablet Take 60 mg by mouth at bedtime.    . B Complex Vitamins (B COMPLEX PO) Take 1 tablet by mouth daily.    . ferrous sulfate 325 (65 FE) MG tablet Take 325 mg by mouth daily with breakfast.    . furosemide (LASIX) 40 MG tablet Take 40 mg by mouth 2 (two) times daily.    Marland Kitchen glipiZIDE (GLUCOTROL) 5 MG tablet Take 5 mg by mouth daily.    Marland Kitchen latanoprost (XALATAN) 0.005 % ophthalmic solution Administer 1 drop to the right eye nightly.    Marland Kitchen lisinopril (PRINIVIL,ZESTRIL) 40 MG tablet Take 40 mg by mouth daily.    . metFORMIN (GLUCOPHAGE) 1000 MG tablet Take 1,000 mg by mouth 2 (two) times daily.    . metoprolol (LOPRESSOR) 100 MG tablet Take 100 mg by mouth 2 (two) times daily.    . Multiple Vitamin (MULTI-VITAMINS) TABS Take by mouth.    . potassium chloride SA (K-DUR,KLOR-CON) 20 MEQ tablet Take 20 mEq by mouth daily.    . timolol (  BETIMOL) 0.25 % ophthalmic solution      No current facility-administered medications for this visit.    OBJECTIVE: There were no vitals filed for this visit.   There is no weight on file to calculate BMI.    ECOG FS:0 - Asymptomatic  General: Well-developed, well-nourished, no acute distress. Eyes: Pink conjunctiva, anicteric sclera. Lungs: Clear to auscultation bilaterally. Heart: Regular rate and rhythm. No rubs, murmurs, or gallops. Abdomen: Soft, nontender, nondistended. No organomegaly noted, normoactive bowel sounds. Musculoskeletal: No edema, cyanosis, or clubbing. Neuro: Alert, answering all questions appropriately. Cranial nerves grossly intact. Skin:  No rashes or petechiae noted. Psych: Normal affect.   LAB RESULTS:  Lab Results  Component Value Date   NA 137 06/29/2015   K 3.3* 06/29/2015   CL 102 06/29/2015   CO2 27 06/29/2015   GLUCOSE 84 06/29/2015   BUN 22* 06/29/2015   CREATININE 1.71* 06/29/2015   CALCIUM 8.3* 06/29/2015   PROT 8.3* 06/15/2015   ALBUMIN 3.7 06/15/2015   AST 26 06/15/2015   ALT 15* 06/15/2015   ALKPHOS 91 06/15/2015   BILITOT 1.3* 06/15/2015   GFRNONAA 37* 06/29/2015   GFRAA 43* 06/29/2015    Lab Results  Component Value Date   WBC 7.2 06/29/2015   NEUTROABS 5.4 06/29/2015   HGB 12.8* 06/29/2015   HCT 39.7* 06/29/2015   MCV 84.7 06/29/2015   PLT 188 06/29/2015     STUDIES: Dg Chest 2 View  06/15/2015   CLINICAL DATA:  Shortness of breath and urinary retention for 2 days. Pulmonary fibrosis. Congestive heart failure. COPD. Prostate carcinoma.  EXAM: CHEST  2 VIEW  COMPARISON:  04/05/2015 and older studies dating back to 03/13/2013  FINDINGS: Heart size is within normal limits. Coarse interstitial infiltrates are again seen bilaterally showing no significant change dating back to 2014 exam, consistent with chronic pulmonary fibrosis. No evidence of superimposed pulmonary consolidation. Mild left pleural thickening also appears stable.  IMPRESSION: Chronic interstitial lung disease, consistent with known pulmonary fibrosis. No acute or superimposed process identified.   Electronically Signed   By: Earle Gell M.D.   On: 06/15/2015 19:36    ASSESSMENT: Prostate cancer unknown Gleason score status post prostatectomy and prostatic bed XRT, now with increasing PSA.  PLAN:    1. Prostate cancer: Patient's PSA on May 18, 2014 was 0.3 and has now increased to 11.58. Previously, nuclear med bone scan was negative for disease, but will repeat in the next 1-2 weeks to assess for interval change. Patient last received Lupron on October 24, 2012.  Return to clinic 6 months after his Lupron injection for  repeat laboratory work and further evaluation. 2.  Osteoporosis: Bone mineral density in November 2012 revealed a T score of -2.8. Continue Fosamax, calcium, and vitamin D. 3. Possible lung cancer: Although patient did not report this to medical oncology, he is being evaluated by radiation oncology for a reported biopsy-proven's non-small cell lung cancer of his lung. He is scheduled for SBRT at the end of October. 4. Weakness and fatigue: Likely multifactorial. Patient's iron stores and thyroid panel are within normal limits.   Patient expressed understanding and was in agreement with this plan. He also understands that He can call clinic at any time with any questions, concerns, or complaints.    Lloyd Huger, MD   07/10/2015 11:18 AM

## 2015-07-11 ENCOUNTER — Ambulatory Visit: Payer: Medicare Other

## 2015-07-14 ENCOUNTER — Ambulatory Visit: Payer: Medicare Other

## 2015-07-21 ENCOUNTER — Ambulatory Visit: Payer: Medicare Other

## 2015-07-21 ENCOUNTER — Ambulatory Visit: Payer: Medicare Other | Admitting: Radiation Oncology

## 2015-07-26 ENCOUNTER — Ambulatory Visit: Payer: Medicare Other | Admitting: Radiation Oncology

## 2015-07-26 ENCOUNTER — Other Ambulatory Visit: Payer: Self-pay | Admitting: *Deleted

## 2015-07-26 DIAGNOSIS — J984 Other disorders of lung: Secondary | ICD-10-CM

## 2015-07-28 ENCOUNTER — Ambulatory Visit: Payer: Medicare Other | Admitting: Radiation Oncology

## 2015-08-02 ENCOUNTER — Ambulatory Visit: Payer: Medicare Other | Admitting: Radiation Oncology

## 2015-08-03 ENCOUNTER — Ambulatory Visit: Payer: Medicare Other

## 2015-08-04 ENCOUNTER — Ambulatory Visit: Payer: 59 | Admitting: Obstetrics and Gynecology

## 2015-08-04 ENCOUNTER — Ambulatory Visit: Payer: PRIVATE HEALTH INSURANCE | Admitting: Radiation Oncology

## 2015-08-04 ENCOUNTER — Ambulatory Visit: Payer: Medicare Other | Admitting: Radiation Oncology

## 2015-08-08 ENCOUNTER — Other Ambulatory Visit: Payer: Self-pay | Admitting: *Deleted

## 2015-08-09 ENCOUNTER — Ambulatory Visit: Payer: Medicare Other | Admitting: Radiation Oncology

## 2015-08-09 ENCOUNTER — Ambulatory Visit
Admission: RE | Admit: 2015-08-09 | Discharge: 2015-08-09 | Disposition: A | Discharge status: Home / Self Care | Payer: Medicare Other | Source: Ambulatory Visit | Attending: Radiation Oncology | Admitting: Radiation Oncology

## 2015-08-09 DIAGNOSIS — C3490 Malignant neoplasm of unspecified part of unspecified bronchus or lung: Secondary | ICD-10-CM | POA: Diagnosis not present

## 2015-08-09 DIAGNOSIS — I2510 Atherosclerotic heart disease of native coronary artery without angina pectoris: Secondary | ICD-10-CM | POA: Insufficient documentation

## 2015-08-09 DIAGNOSIS — J984 Other disorders of lung: Secondary | ICD-10-CM | POA: Diagnosis present

## 2015-08-09 DIAGNOSIS — J84112 Idiopathic pulmonary fibrosis: Secondary | ICD-10-CM | POA: Diagnosis not present

## 2015-08-09 DIAGNOSIS — J90 Pleural effusion, not elsewhere classified: Secondary | ICD-10-CM | POA: Insufficient documentation

## 2015-08-09 MED ORDER — IOHEXOL 300 MG/ML  SOLN
75.0000 mL | Freq: Once | INTRAMUSCULAR | Status: AC | PRN
  Administered 2015-08-09: 60 mL via INTRAVENOUS

## 2015-08-10 ENCOUNTER — Encounter: Payer: Self-pay | Admitting: Radiation Oncology

## 2015-08-10 ENCOUNTER — Ambulatory Visit
Admission: RE | Admit: 2015-08-10 | Discharge: 2015-08-10 | Disposition: A | Discharge status: Home / Self Care | Payer: Medicare Other | Source: Ambulatory Visit | Attending: Radiation Oncology | Admitting: Radiation Oncology

## 2015-08-10 DIAGNOSIS — Z510 Encounter for antineoplastic radiation therapy: Secondary | ICD-10-CM | POA: Insufficient documentation

## 2015-08-10 DIAGNOSIS — Z87891 Personal history of nicotine dependence: Secondary | ICD-10-CM | POA: Insufficient documentation

## 2015-08-10 DIAGNOSIS — R918 Other nonspecific abnormal finding of lung field: Secondary | ICD-10-CM

## 2015-08-10 DIAGNOSIS — C3431 Malignant neoplasm of lower lobe, right bronchus or lung: Secondary | ICD-10-CM | POA: Insufficient documentation

## 2015-08-10 NOTE — Progress Notes (Signed)
Radiation Oncology Follow up Note  Name: Gabriel Gregory   Date:   08/10/2015 MRN:  [id number] DOB: 26-Mar-1936    This 79 y.o. male presents to the clinic today for review of his results from recent CT scan of the chest.  REFERRING PROVIDER: Lloyd Huger, MD  HPI: Patient is a 79 year old male originally consult in Re: Right lower lobe lung mass worked up at the [facility] with CT-guided biopsy positive for non-small cell lung cancer. He also been treated our department status post radical prostatectomy back in April 2016 with salvage radiation therapy for prostatectomy 20 years prior. He had a repeat CT scan performed. Scan showed approximately 3.7 cm right lower lobe superior segment mass with bilateral pleural effusions. He also had indeterminate mediastinal lymphadenopathy. No evidence of metastatic bone disease was noted. He has some labored breathing. He does take significant amount of Lasix every day as well as potassium supplements. He is seen today discuss role for radiation therapy for his right lower lobe lesion.  COMPLICATIONS OF TREATMENT: none  FOLLOW UP COMPLIANCE: keeps appointments   PHYSICAL EXAM:  There were no vitals taken for this visit. Well-developed elderly male in NAD. Patient is obese. He is somewhat short of breath. Well-developed well-nourished patient in NAD. HEENT reveals PERLA, EOMI, discs not visualized.  Oral cavity is clear. No oral mucosal lesions are identified. Neck is clear without evidence of cervical or supraclavicular adenopathy. Lungs are clear to A&P. Cardiac examination is essentially unremarkable with regular rate and rhythm without murmur rub or thrill. Abdomen is benign with no organomegaly or masses noted. Motor sensory and DTR levels are equal and symmetric in the upper and lower extremities. Cranial nerves II through XII are grossly intact. Proprioception is intact. No peripheral adenopathy or edema is identified. No motor or sensory levels  are noted. Crude visual fields are within normal range.  RADIOLOGY RESULTS: Recent CT scans are reviewed compatible above-stated findings.  PLAN: I believe this lesion at this time borders on the ability to do SB RT. I would opt to do I am RT treatment planning and delivery 6000 cGy in 10 fractions for a hypofractionated course of treatment. I believe I can reduce the dose to was overall lung volume as well as heart using I am RT treatment planning and delivery. I have set up and ordered CT simulation for early next week. Risks and benefits of treatment including possible increased productive cough loss of normal lung volume, fatigue, alteration of blood counts, possible skin reaction all were discussed in detail with the patient. He seems to comprehend my treatment plan well.  I would like to take this opportunity for allowing me to participate in the care of your patient.Armstead Peaks., MD

## 2015-08-11 ENCOUNTER — Ambulatory Visit: Payer: Medicare Other | Admitting: Radiation Oncology

## 2015-08-15 ENCOUNTER — Ambulatory Visit
Admission: RE | Admit: 2015-08-15 | Discharge: 2015-08-15 | Disposition: A | Discharge status: Home / Self Care | Payer: Medicare Other | Source: Ambulatory Visit | Attending: Radiation Oncology | Admitting: Radiation Oncology

## 2015-08-15 DIAGNOSIS — Z510 Encounter for antineoplastic radiation therapy: Secondary | ICD-10-CM | POA: Diagnosis not present

## 2015-08-15 DIAGNOSIS — Z87891 Personal history of nicotine dependence: Secondary | ICD-10-CM | POA: Diagnosis not present

## 2015-08-15 DIAGNOSIS — C3431 Malignant neoplasm of lower lobe, right bronchus or lung: Secondary | ICD-10-CM | POA: Diagnosis present

## 2015-08-16 ENCOUNTER — Ambulatory Visit: Payer: Medicare Other | Admitting: Radiation Oncology

## 2015-08-17 DIAGNOSIS — C3431 Malignant neoplasm of lower lobe, right bronchus or lung: Secondary | ICD-10-CM | POA: Diagnosis not present

## 2015-08-23 DIAGNOSIS — C3431 Malignant neoplasm of lower lobe, right bronchus or lung: Secondary | ICD-10-CM | POA: Diagnosis not present

## 2015-08-29 ENCOUNTER — Other Ambulatory Visit: Payer: Self-pay | Admitting: *Deleted

## 2015-08-29 ENCOUNTER — Ambulatory Visit: Payer: Medicare Other | Admitting: Radiation Oncology

## 2015-08-29 ENCOUNTER — Ambulatory Visit
Admission: RE | Admit: 2015-08-29 | Discharge: 2015-08-29 | Disposition: A | Discharge status: Home / Self Care | Payer: Medicare Other | Source: Ambulatory Visit | Attending: Radiation Oncology | Admitting: Radiation Oncology

## 2015-08-29 DIAGNOSIS — C3431 Malignant neoplasm of lower lobe, right bronchus or lung: Secondary | ICD-10-CM

## 2015-08-30 ENCOUNTER — Ambulatory Visit
Admission: RE | Admit: 2015-08-30 | Discharge: 2015-08-30 | Disposition: A | Discharge status: Home / Self Care | Payer: Medicare Other | Source: Ambulatory Visit | Attending: Radiation Oncology | Admitting: Radiation Oncology

## 2015-08-30 ENCOUNTER — Ambulatory Visit: Payer: Medicare Other

## 2015-08-30 DIAGNOSIS — C3431 Malignant neoplasm of lower lobe, right bronchus or lung: Secondary | ICD-10-CM | POA: Diagnosis not present

## 2015-08-31 ENCOUNTER — Ambulatory Visit: Payer: Medicare Other

## 2015-08-31 ENCOUNTER — Ambulatory Visit: Payer: Medicare Other | Admitting: Radiation Oncology

## 2015-08-31 ENCOUNTER — Ambulatory Visit
Admission: RE | Admit: 2015-08-31 | Discharge: 2015-08-31 | Disposition: A | Discharge status: Home / Self Care | Payer: Medicare Other | Source: Ambulatory Visit | Attending: Radiation Oncology | Admitting: Radiation Oncology

## 2015-08-31 DIAGNOSIS — C3431 Malignant neoplasm of lower lobe, right bronchus or lung: Secondary | ICD-10-CM | POA: Diagnosis not present

## 2015-09-01 ENCOUNTER — Ambulatory Visit
Admission: RE | Admit: 2015-09-01 | Discharge: 2015-09-01 | Disposition: A | Discharge status: Home / Self Care | Payer: Medicare Other | Source: Ambulatory Visit | Attending: Radiation Oncology | Admitting: Radiation Oncology

## 2015-09-01 ENCOUNTER — Ambulatory Visit: Admission: RE | Admit: 2015-09-01 | Payer: Medicare Other | Source: Ambulatory Visit

## 2015-09-01 ENCOUNTER — Ambulatory Visit: Payer: Medicare Other

## 2015-09-02 ENCOUNTER — Ambulatory Visit: Payer: Medicare Other

## 2015-09-02 ENCOUNTER — Ambulatory Visit
Admission: RE | Admit: 2015-09-02 | Discharge: 2015-09-02 | Disposition: A | Discharge status: Home / Self Care | Payer: Medicare Other | Source: Ambulatory Visit | Attending: Radiation Oncology | Admitting: Radiation Oncology

## 2015-09-05 ENCOUNTER — Ambulatory Visit
Admission: RE | Admit: 2015-09-05 | Discharge: 2015-09-05 | Disposition: A | Discharge status: Home / Self Care | Payer: Medicare Other | Source: Ambulatory Visit | Attending: Radiation Oncology | Admitting: Radiation Oncology

## 2015-09-05 ENCOUNTER — Ambulatory Visit: Payer: Medicare Other

## 2015-09-05 DIAGNOSIS — C3431 Malignant neoplasm of lower lobe, right bronchus or lung: Secondary | ICD-10-CM | POA: Diagnosis not present

## 2015-09-06 ENCOUNTER — Inpatient Hospital Stay: Payer: Medicare Other | Attending: Radiation Oncology

## 2015-09-06 ENCOUNTER — Ambulatory Visit: Payer: Medicare Other

## 2015-09-06 ENCOUNTER — Ambulatory Visit
Admission: RE | Admit: 2015-09-06 | Discharge: 2015-09-06 | Disposition: A | Discharge status: Home / Self Care | Payer: Medicare Other | Source: Ambulatory Visit | Attending: Radiation Oncology | Admitting: Radiation Oncology

## 2015-09-06 DIAGNOSIS — R9721 Rising PSA following treatment for malignant neoplasm of prostate: Secondary | ICD-10-CM | POA: Insufficient documentation

## 2015-09-06 DIAGNOSIS — Z87891 Personal history of nicotine dependence: Secondary | ICD-10-CM | POA: Diagnosis not present

## 2015-09-06 DIAGNOSIS — E119 Type 2 diabetes mellitus without complications: Secondary | ICD-10-CM | POA: Insufficient documentation

## 2015-09-06 DIAGNOSIS — M818 Other osteoporosis without current pathological fracture: Secondary | ICD-10-CM | POA: Insufficient documentation

## 2015-09-06 DIAGNOSIS — R609 Edema, unspecified: Secondary | ICD-10-CM | POA: Insufficient documentation

## 2015-09-06 DIAGNOSIS — I517 Cardiomegaly: Secondary | ICD-10-CM | POA: Diagnosis not present

## 2015-09-06 DIAGNOSIS — C3431 Malignant neoplasm of lower lobe, right bronchus or lung: Secondary | ICD-10-CM | POA: Diagnosis not present

## 2015-09-06 DIAGNOSIS — I10 Essential (primary) hypertension: Secondary | ICD-10-CM | POA: Insufficient documentation

## 2015-09-06 DIAGNOSIS — C61 Malignant neoplasm of prostate: Secondary | ICD-10-CM | POA: Insufficient documentation

## 2015-09-06 DIAGNOSIS — N289 Disorder of kidney and ureter, unspecified: Secondary | ICD-10-CM | POA: Insufficient documentation

## 2015-09-06 DIAGNOSIS — Z7982 Long term (current) use of aspirin: Secondary | ICD-10-CM | POA: Insufficient documentation

## 2015-09-06 DIAGNOSIS — J449 Chronic obstructive pulmonary disease, unspecified: Secondary | ICD-10-CM | POA: Insufficient documentation

## 2015-09-06 DIAGNOSIS — J8410 Pulmonary fibrosis, unspecified: Secondary | ICD-10-CM | POA: Diagnosis not present

## 2015-09-06 DIAGNOSIS — Z79899 Other long term (current) drug therapy: Secondary | ICD-10-CM | POA: Insufficient documentation

## 2015-09-06 DIAGNOSIS — Z7984 Long term (current) use of oral hypoglycemic drugs: Secondary | ICD-10-CM | POA: Insufficient documentation

## 2015-09-06 DIAGNOSIS — R5383 Other fatigue: Secondary | ICD-10-CM | POA: Diagnosis not present

## 2015-09-06 DIAGNOSIS — I509 Heart failure, unspecified: Secondary | ICD-10-CM | POA: Diagnosis not present

## 2015-09-06 DIAGNOSIS — J9811 Atelectasis: Secondary | ICD-10-CM | POA: Insufficient documentation

## 2015-09-06 DIAGNOSIS — Z8669 Personal history of other diseases of the nervous system and sense organs: Secondary | ICD-10-CM | POA: Diagnosis not present

## 2015-09-06 DIAGNOSIS — J90 Pleural effusion, not elsewhere classified: Secondary | ICD-10-CM | POA: Insufficient documentation

## 2015-09-06 DIAGNOSIS — R531 Weakness: Secondary | ICD-10-CM | POA: Diagnosis not present

## 2015-09-06 LAB — CBC
HCT: 43.6 % (ref 40.0–52.0)
HEMOGLOBIN: 13.9 g/dL (ref 13.0–18.0)
MCH: 27.9 pg (ref 26.0–34.0)
MCHC: 32 g/dL (ref 32.0–36.0)
MCV: 87 fL (ref 80.0–100.0)
PLATELETS: 142 10*3/uL — AB (ref 150–440)
RBC: 5 MIL/uL (ref 4.40–5.90)
RDW: 19.6 % — ABNORMAL HIGH (ref 11.5–14.5)
WBC: 6.2 10*3/uL (ref 3.8–10.6)

## 2015-09-07 ENCOUNTER — Ambulatory Visit: Payer: Medicare Other

## 2015-09-07 ENCOUNTER — Ambulatory Visit
Admission: RE | Admit: 2015-09-07 | Discharge: 2015-09-07 | Disposition: A | Discharge status: Home / Self Care | Payer: Medicare Other | Source: Ambulatory Visit | Attending: Radiation Oncology | Admitting: Radiation Oncology

## 2015-09-07 DIAGNOSIS — C3431 Malignant neoplasm of lower lobe, right bronchus or lung: Secondary | ICD-10-CM | POA: Diagnosis not present

## 2015-09-08 ENCOUNTER — Ambulatory Visit: Payer: Medicare Other

## 2015-09-08 ENCOUNTER — Ambulatory Visit
Admission: RE | Admit: 2015-09-08 | Discharge: 2015-09-08 | Disposition: A | Discharge status: Home / Self Care | Payer: Medicare Other | Source: Ambulatory Visit | Attending: Radiation Oncology | Admitting: Radiation Oncology

## 2015-09-08 DIAGNOSIS — C3431 Malignant neoplasm of lower lobe, right bronchus or lung: Secondary | ICD-10-CM | POA: Diagnosis not present

## 2015-09-09 ENCOUNTER — Ambulatory Visit
Admission: RE | Admit: 2015-09-09 | Discharge: 2015-09-09 | Disposition: A | Discharge status: Home / Self Care | Payer: Medicare Other | Source: Ambulatory Visit | Attending: Radiation Oncology | Admitting: Radiation Oncology

## 2015-09-09 ENCOUNTER — Ambulatory Visit: Payer: Medicare Other

## 2015-09-09 DIAGNOSIS — C3431 Malignant neoplasm of lower lobe, right bronchus or lung: Secondary | ICD-10-CM | POA: Diagnosis not present

## 2015-09-12 ENCOUNTER — Ambulatory Visit
Admission: RE | Admit: 2015-09-12 | Discharge: 2015-09-12 | Disposition: A | Discharge status: Home / Self Care | Payer: Medicare Other | Source: Ambulatory Visit | Attending: Radiation Oncology | Admitting: Radiation Oncology

## 2015-09-12 ENCOUNTER — Ambulatory Visit: Payer: Medicare Other

## 2015-09-12 DIAGNOSIS — C3431 Malignant neoplasm of lower lobe, right bronchus or lung: Secondary | ICD-10-CM | POA: Diagnosis not present

## 2015-09-13 ENCOUNTER — Ambulatory Visit: Payer: Medicare Other

## 2015-09-13 ENCOUNTER — Ambulatory Visit
Admission: RE | Admit: 2015-09-13 | Discharge: 2015-09-13 | Disposition: A | Discharge status: Home / Self Care | Payer: Medicare Other | Source: Ambulatory Visit | Attending: Radiation Oncology | Admitting: Radiation Oncology

## 2015-09-13 ENCOUNTER — Inpatient Hospital Stay (HOSPITAL_BASED_OUTPATIENT_CLINIC_OR_DEPARTMENT_OTHER): Payer: Medicare Other | Admitting: Oncology

## 2015-09-13 ENCOUNTER — Emergency Department: Payer: Medicare Other

## 2015-09-13 ENCOUNTER — Encounter: Payer: Self-pay | Admitting: Emergency Medicine

## 2015-09-13 ENCOUNTER — Inpatient Hospital Stay
Admission: EM | Admit: 2015-09-13 | Discharge: 2015-10-02 | DRG: 291 | Disposition: E | Payer: Medicare Other | Attending: Internal Medicine | Admitting: Internal Medicine

## 2015-09-13 VITALS — BP 124/85 | HR 105 | Temp 98.3°F | Resp 16

## 2015-09-13 DIAGNOSIS — D696 Thrombocytopenia, unspecified: Secondary | ICD-10-CM | POA: Diagnosis present

## 2015-09-13 DIAGNOSIS — E119 Type 2 diabetes mellitus without complications: Secondary | ICD-10-CM

## 2015-09-13 DIAGNOSIS — R778 Other specified abnormalities of plasma proteins: Secondary | ICD-10-CM | POA: Diagnosis present

## 2015-09-13 DIAGNOSIS — I5023 Acute on chronic systolic (congestive) heart failure: Secondary | ICD-10-CM | POA: Diagnosis present

## 2015-09-13 DIAGNOSIS — C3431 Malignant neoplasm of lower lobe, right bronchus or lung: Secondary | ICD-10-CM | POA: Diagnosis present

## 2015-09-13 DIAGNOSIS — I248 Other forms of acute ischemic heart disease: Secondary | ICD-10-CM | POA: Diagnosis present

## 2015-09-13 DIAGNOSIS — R531 Weakness: Secondary | ICD-10-CM

## 2015-09-13 DIAGNOSIS — Z66 Do not resuscitate: Secondary | ICD-10-CM | POA: Diagnosis present

## 2015-09-13 DIAGNOSIS — Z7982 Long term (current) use of aspirin: Secondary | ICD-10-CM | POA: Diagnosis not present

## 2015-09-13 DIAGNOSIS — J90 Pleural effusion, not elsewhere classified: Secondary | ICD-10-CM

## 2015-09-13 DIAGNOSIS — I428 Other cardiomyopathies: Secondary | ICD-10-CM | POA: Insufficient documentation

## 2015-09-13 DIAGNOSIS — J9811 Atelectasis: Secondary | ICD-10-CM | POA: Diagnosis not present

## 2015-09-13 DIAGNOSIS — R9721 Rising PSA following treatment for malignant neoplasm of prostate: Secondary | ICD-10-CM

## 2015-09-13 DIAGNOSIS — J8410 Pulmonary fibrosis, unspecified: Secondary | ICD-10-CM | POA: Diagnosis present

## 2015-09-13 DIAGNOSIS — I509 Heart failure, unspecified: Secondary | ICD-10-CM | POA: Diagnosis not present

## 2015-09-13 DIAGNOSIS — C61 Malignant neoplasm of prostate: Secondary | ICD-10-CM | POA: Diagnosis not present

## 2015-09-13 DIAGNOSIS — R000 Tachycardia, unspecified: Secondary | ICD-10-CM | POA: Diagnosis present

## 2015-09-13 DIAGNOSIS — Z8669 Personal history of other diseases of the nervous system and sense organs: Secondary | ICD-10-CM | POA: Diagnosis not present

## 2015-09-13 DIAGNOSIS — Z515 Encounter for palliative care: Secondary | ICD-10-CM | POA: Diagnosis present

## 2015-09-13 DIAGNOSIS — M818 Other osteoporosis without current pathological fracture: Secondary | ICD-10-CM | POA: Diagnosis not present

## 2015-09-13 DIAGNOSIS — R600 Localized edema: Secondary | ICD-10-CM | POA: Insufficient documentation

## 2015-09-13 DIAGNOSIS — C3490 Malignant neoplasm of unspecified part of unspecified bronchus or lung: Secondary | ICD-10-CM

## 2015-09-13 DIAGNOSIS — Z87891 Personal history of nicotine dependence: Secondary | ICD-10-CM

## 2015-09-13 DIAGNOSIS — J189 Pneumonia, unspecified organism: Secondary | ICD-10-CM | POA: Diagnosis present

## 2015-09-13 DIAGNOSIS — Z79899 Other long term (current) drug therapy: Secondary | ICD-10-CM

## 2015-09-13 DIAGNOSIS — R5383 Other fatigue: Secondary | ICD-10-CM

## 2015-09-13 DIAGNOSIS — E785 Hyperlipidemia, unspecified: Secondary | ICD-10-CM | POA: Diagnosis present

## 2015-09-13 DIAGNOSIS — D509 Iron deficiency anemia, unspecified: Secondary | ICD-10-CM | POA: Diagnosis present

## 2015-09-13 DIAGNOSIS — J449 Chronic obstructive pulmonary disease, unspecified: Secondary | ICD-10-CM | POA: Diagnosis present

## 2015-09-13 DIAGNOSIS — I517 Cardiomegaly: Secondary | ICD-10-CM

## 2015-09-13 DIAGNOSIS — R0602 Shortness of breath: Secondary | ICD-10-CM | POA: Diagnosis not present

## 2015-09-13 DIAGNOSIS — Z923 Personal history of irradiation: Secondary | ICD-10-CM

## 2015-09-13 DIAGNOSIS — Z9119 Patient's noncompliance with other medical treatment and regimen: Secondary | ICD-10-CM

## 2015-09-13 DIAGNOSIS — I10 Essential (primary) hypertension: Secondary | ICD-10-CM | POA: Diagnosis not present

## 2015-09-13 DIAGNOSIS — I350 Nonrheumatic aortic (valve) stenosis: Secondary | ICD-10-CM | POA: Diagnosis present

## 2015-09-13 DIAGNOSIS — Z8546 Personal history of malignant neoplasm of prostate: Secondary | ICD-10-CM

## 2015-09-13 DIAGNOSIS — E1122 Type 2 diabetes mellitus with diabetic chronic kidney disease: Secondary | ICD-10-CM | POA: Diagnosis present

## 2015-09-13 DIAGNOSIS — N289 Disorder of kidney and ureter, unspecified: Secondary | ICD-10-CM | POA: Diagnosis not present

## 2015-09-13 DIAGNOSIS — R609 Edema, unspecified: Secondary | ICD-10-CM

## 2015-09-13 DIAGNOSIS — I130 Hypertensive heart and chronic kidney disease with heart failure and stage 1 through stage 4 chronic kidney disease, or unspecified chronic kidney disease: Secondary | ICD-10-CM | POA: Diagnosis not present

## 2015-09-13 DIAGNOSIS — Z7984 Long term (current) use of oral hypoglycemic drugs: Secondary | ICD-10-CM | POA: Diagnosis not present

## 2015-09-13 DIAGNOSIS — J9621 Acute and chronic respiratory failure with hypoxia: Secondary | ICD-10-CM | POA: Diagnosis present

## 2015-09-13 DIAGNOSIS — R7989 Other specified abnormal findings of blood chemistry: Secondary | ICD-10-CM

## 2015-09-13 DIAGNOSIS — J440 Chronic obstructive pulmonary disease with acute lower respiratory infection: Secondary | ICD-10-CM | POA: Diagnosis present

## 2015-09-13 DIAGNOSIS — Z452 Encounter for adjustment and management of vascular access device: Secondary | ICD-10-CM

## 2015-09-13 DIAGNOSIS — N183 Chronic kidney disease, stage 3 (moderate): Secondary | ICD-10-CM | POA: Diagnosis present

## 2015-09-13 DIAGNOSIS — R0600 Dyspnea, unspecified: Secondary | ICD-10-CM

## 2015-09-13 HISTORY — DX: Malignant neoplasm of unspecified part of unspecified bronchus or lung: C34.90

## 2015-09-13 LAB — CBC
HCT: 43.4 % (ref 40.0–52.0)
Hemoglobin: 13.7 g/dL (ref 13.0–18.0)
MCH: 27.7 pg (ref 26.0–34.0)
MCHC: 31.5 g/dL — AB (ref 32.0–36.0)
MCV: 87.8 fL (ref 80.0–100.0)
PLATELETS: 112 10*3/uL — AB (ref 150–440)
RBC: 4.94 MIL/uL (ref 4.40–5.90)
RDW: 19.1 % — AB (ref 11.5–14.5)
WBC: 5.4 10*3/uL (ref 3.8–10.6)

## 2015-09-13 LAB — COMPREHENSIVE METABOLIC PANEL
ALBUMIN: 3.3 g/dL — AB (ref 3.5–5.0)
ALBUMIN: 3.3 g/dL — AB (ref 3.5–5.0)
ALK PHOS: 100 U/L (ref 38–126)
ALT: 21 U/L (ref 17–63)
ALT: 66 U/L — ABNORMAL HIGH (ref 17–63)
ANION GAP: 11 (ref 5–15)
ANION GAP: 10 (ref 5–15)
AST: 17 U/L (ref 15–41)
AST: 72 U/L — AB (ref 15–41)
Alkaline Phosphatase: 112 U/L (ref 38–126)
BILIRUBIN TOTAL: 1.9 mg/dL — AB (ref 0.3–1.2)
BUN: 21 mg/dL — AB (ref 6–20)
BUN: 23 mg/dL — ABNORMAL HIGH (ref 6–20)
CALCIUM: 9 mg/dL (ref 8.9–10.3)
CHLORIDE: 101 mmol/L (ref 101–111)
CO2: 28 mmol/L (ref 22–32)
CO2: 24 mmol/L (ref 22–32)
CREATININE: 1.4 mg/dL — AB (ref 0.61–1.24)
Calcium: 9.2 mg/dL (ref 8.9–10.3)
Chloride: 98 mmol/L — ABNORMAL LOW (ref 101–111)
Creatinine, Ser: 1.54 mg/dL — ABNORMAL HIGH (ref 0.61–1.24)
GFR calc Af Amer: 54 mL/min — ABNORMAL LOW (ref >60)
GFR calc Af Amer: 48 mL/min — ABNORMAL LOW (ref >60)
GFR calc non Af Amer: 47 mL/min — ABNORMAL LOW (ref >60)
GFR calc non Af Amer: 42 mL/min — ABNORMAL LOW (ref >60)
GLUCOSE: 133 mg/dL — AB (ref 65–99)
Glucose, Bld: 117 mg/dL — ABNORMAL HIGH (ref 65–99)
Potassium: 3.3 mmol/L — ABNORMAL LOW (ref 3.5–5.1)
Potassium: 3.9 mmol/L (ref 3.5–5.1)
SODIUM: 137 mmol/L (ref 135–145)
Sodium: 135 mmol/L (ref 135–145)
TOTAL PROTEIN: 7.6 g/dL (ref 6.5–8.1)
TOTAL PROTEIN: 8.1 g/dL (ref 6.5–8.1)
Total Bilirubin: 1.4 mg/dL — ABNORMAL HIGH (ref 0.3–1.2)

## 2015-09-13 LAB — PSA: PSA: 11.3 ng/mL — AB (ref 0.00–4.00)

## 2015-09-13 LAB — CBC WITH DIFFERENTIAL/PLATELET
BASOS ABS: 0 10*3/uL (ref 0–0.1)
BASOS PCT: 1 %
EOS ABS: 0 10*3/uL (ref 0–0.7)
Eosinophils Relative: 0 %
HEMATOCRIT: 43.4 % (ref 40.0–52.0)
HEMOGLOBIN: 13.9 g/dL (ref 13.0–18.0)
Lymphocytes Relative: 6 %
Lymphs Abs: 0.3 10*3/uL — ABNORMAL LOW (ref 1.0–3.6)
MCH: 28 pg (ref 26.0–34.0)
MCHC: 32 g/dL (ref 32.0–36.0)
MCV: 87.7 fL (ref 80.0–100.0)
MONOS PCT: 6 %
Monocytes Absolute: 0.4 10*3/uL (ref 0.2–1.0)
NEUTROS ABS: 5.1 10*3/uL (ref 1.4–6.5)
NEUTROS PCT: 87 %
Platelets: 125 10*3/uL — ABNORMAL LOW (ref 150–440)
RBC: 4.95 MIL/uL (ref 4.40–5.90)
RDW: 18.9 % — ABNORMAL HIGH (ref 11.5–14.5)
WBC: 5.8 10*3/uL (ref 3.8–10.6)

## 2015-09-13 LAB — BRAIN NATRIURETIC PEPTIDE: B NATRIURETIC PEPTIDE 5: 3547 pg/mL — AB (ref 0.0–100.0)

## 2015-09-13 LAB — TROPONIN I: TROPONIN I: 0.08 ng/mL — AB (ref <0.031)

## 2015-09-13 MED ORDER — FUROSEMIDE 10 MG/ML IJ SOLN
40.0000 mg | Freq: Once | INTRAMUSCULAR | Status: AC
  Administered 2015-09-13: 40 mg via INTRAVENOUS
  Filled 2015-09-13: qty 4

## 2015-09-13 NOTE — ED Notes (Signed)
Pt arrived to the ED via EMS from home for SOB and general weakness. Pt reports that he is a cancer Pt with cancer on the left lung, receiving radiation with the last session being today. Pt reports feeling weak. Pt is AOx4 in mild respiratory distress.

## 2015-09-13 NOTE — Progress Notes (Signed)
Patient received his last XRT today.  He is having difficulty sleeping and Dr. Baruch Gouty advised him to try Benadryl.  Also having bilateral lower extremity edema that is treated by cardiologist.

## 2015-09-13 NOTE — ED Provider Notes (Signed)
[facility] Emergency Department Provider Note  ____________________________________________  Time seen: Approximately 2310 PM  I have reviewed the triage vital signs and the nursing notes.   HISTORY  Chief Complaint Shortness of Breath and Weakness    HPI Gabriel Gregory is a 79 y.o. male who comes into the hospital today with difficulty breathing. According to the patient he has some fluid on his legs and his ankles. The patient had radiation this morning and reports that he could not walk out after the radiation. The patient has cancer to his right lower lobe and has undergo 10 treatments of radiation. According to the patient's daughter he also has CHF COPD and a triple-A. She reports that the patient does not take his medications regularly and does continue to smoke. He has recently been to the [facility] for urine infection. He does have a history of prostate cancer and is supposed to himself or times daily but he does not catheter himself regularly. The patient's daughter reports that given his medical history he does have some shortness of breath at rest but he is not typically is winded as he is today. The patient's daughter reports that he is unable to lay back flat and he gets very short of breath whenever he walks anywhere. He typically leans forward. He's had some decreased intake per the remembers. The family was concerned as he is typically not in this bad shape so they decided to bring him into the hospital for evaluation.   Past Medical History  Diagnosis Date  . COPD (chronic obstructive pulmonary disease) ([place])   . CHF (congestive heart failure) ([place])   . Diabetes mellitus without complication ([place])   . Hypertension   . Pulmonary fibrosis ([place])   . Renal disorder   . Aortic aneurysm ([place])   . Prostate cancer ([place])   . Lung cancer [facility])     Patient Active Problem List   Diagnosis Date Noted  . CHF, acute on chronic ([place]) 09/14/2015  .  Elevated troponin 09/14/2015  . Lung cancer ([place]) 09/14/2015  . COPD (chronic obstructive pulmonary disease) ([place]) 09/14/2015  . DM (diabetes mellitus) ([place]) 09/14/2015  . HTN (hypertension), benign 09/14/2015  . Urinary retention 06/23/2015  . Prostate cancer ([place]) 05/23/2015    Past Surgical History  Procedure Laterality Date  . Back surgery    . Prostate surgery      No current outpatient prescriptions on file.  Allergies Bee venom; Felodipine; and Simvastatin  Family History  Problem Relation Age of Onset  . Thyroid disease Mother     Social History Social History  Substance Use Topics  . Smoking status: Former Smoker    Quit date: 03/27/2015  . Smokeless tobacco: None  . Alcohol Use: No    Review of Systems Constitutional: No fever/chills Eyes: No visual changes. ENT: No sore throat. Cardiovascular: Denies chest pain. Respiratory:  shortness of breath. Gastrointestinal: No abdominal pain.  No nausea, no vomiting.  No diarrhea.  No constipation. Genitourinary: Negative for dysuria. Musculoskeletal: Negative for back pain. Skin: Negative for rash. Neurological: Negative for headaches, focal weakness or numbness. Lymph: Swelling to lower extremities  10-point ROS otherwise negative.  ____________________________________________   PHYSICAL EXAM:  VITAL SIGNS: ED Triage Vitals  Enc Vitals Group     BP 09/26/2015 2224 129/83 mmHg     Pulse Rate 09/10/2015 2224 107     Resp 09/07/2015 2224 28     Temp 09/12/2015 2224 97.5 F (36.4 C)  Temp Source 09/12/2015 2224 Oral     SpO2 09/14/2015 2224 94 %     Weight 09/01/2015 2224 210 lb (95.255 kg)     Height 09/12/2015 2224 '5\' 6"'$  (1.676 m)     Head Cir --      Peak Flow --      Pain Score 09/04/2015 2227 8     Pain Loc --      Pain Edu? --      Excl. in [place]? --     Constitutional: Alert and oriented. Well appearing and in moderate respiratory distress. Eyes: Conjunctivae are normal. PERRL. EOMI. Head:  Atraumatic. Nose: No congestion/rhinnorhea. Mouth/Throat: Mucous membranes are moist.  Oropharynx non-erythematous. Cardiovascular: Tachycardic, regular rhythm. Grossly normal heart sounds.  Good peripheral circulation. Respiratory: Increased respiratory effort.  No retractions. Diminished breath sounds in bilateral bases Gastrointestinal: Soft and nontender. No distention. Positive bowel sounds Musculoskeletal: Significant bilateral lower extremity edema Neurologic:  Normal speech and language.  Skin:  Skin is warm, dry and intact.  Psychiatric: Mood and affect are normal.   ____________________________________________   LABS (all labs ordered are listed, but only abnormal results are displayed)  Labs Reviewed  CBC - Abnormal; Notable for the following:    MCHC 31.5 (*)    RDW 19.1 (*)    Platelets 112 (*)    All other components within normal limits  COMPREHENSIVE METABOLIC PANEL - Abnormal; Notable for the following:    Glucose, Bld 133 (*)    BUN 23 (*)    Creatinine, Ser 1.54 (*)    Albumin 3.3 (*)    AST 72 (*)    ALT 66 (*)    Total Bilirubin 1.9 (*)    GFR calc non Af Amer 42 (*)    GFR calc Af Amer 48 (*)    All other components within normal limits  TROPONIN I - Abnormal; Notable for the following:    Troponin I 0.08 (*)    All other components within normal limits  BRAIN NATRIURETIC PEPTIDE - Abnormal; Notable for the following:    B Natriuretic Peptide >4500.0 (*)    All other components within normal limits  URINALYSIS COMPLETEWITH MICROSCOPIC ([facility] ONLY) - Abnormal; Notable for the following:    Color, Urine YELLOW (*)    APPearance CLOUDY (*)    Hgb urine dipstick 3+ (*)    Protein, ur 100 (*)    Leukocytes, UA 1+ (*)    Squamous Epithelial / LPF 0-5 (*)    All other components within normal limits  TROPONIN I - Abnormal; Notable for the following:    Troponin I 0.09 (*)    All other components within normal limits  BASIC METABOLIC PANEL - Abnormal;  Notable for the following:    Glucose, Bld 155 (*)    BUN 23 (*)    Creatinine, Ser 1.44 (*)    GFR calc non Af Amer 45 (*)    GFR calc Af Amer 53 (*)    All other components within normal limits  CBC - Abnormal; Notable for the following:    RDW 19.4 (*)    Platelets 106 (*)    All other components within normal limits  GLUCOSE, CAPILLARY - Abnormal; Notable for the following:    Glucose-Capillary 151 (*)    All other components within normal limits  GLUCOSE, CAPILLARY - Abnormal; Notable for the following:    Glucose-Capillary 152 (*)    All other components within normal limits  MRSA  PCR SCREENING  TROPONIN I  TROPONIN I   ____________________________________________  EKG  ED ECG REPORT I, Loney Hering, the attending physician, personally viewed and interpreted this ECG.   Date: 09/14/2015  EKG Time: 2225  Rate: 127  Rhythm: normal sinus rhythm  Axis: Normal  Intervals:none  ST&T Change: None  ____________________________________________  RADIOLOGY  Chest x-ray: Congestive heart failure, superimposed upon pulmonary fibrosis, small bilateral pleural effusions with bibasilar airspace disease, atelectasis or concurrent infection. ____________________________________________   PROCEDURES  Procedure(s) performed: None  Critical Care performed: No  ____________________________________________   INITIAL IMPRESSION / ASSESSMENT AND PLAN / ED COURSE  Pertinent labs & imaging results that were available during my care of the patient were reviewed by me and considered in my medical decision making (see chart for details).  This is a 79 year old male who comes in with a history of heart failure and lung cancer who is having shortness of breath. The patient is not very compliant with his medication and has some significant lower extremity edema. I'm concerned the patient may be having a CHF exacerbation as I will give him a dose of Lasix 40 mg IV 1 dose and  place a Foley catheter to help with his urine output. I will check the patient's blood work and reassess him once he received his medications.  The patient does appear to be in CHF exacerbation as I will admit him to the hospital for further evaluation and treatment. ____________________________________________   FINAL CLINICAL IMPRESSION(S) / ED DIAGNOSES  Final diagnoses:  Acute on chronic congestive heart failure, unspecified congestive heart failure type [facility])  Dyspnea      Loney Hering, MD 09/14/15 [phone]

## 2015-09-14 ENCOUNTER — Inpatient Hospital Stay: Admit: 2015-09-14 | Payer: Medicare Other

## 2015-09-14 ENCOUNTER — Inpatient Hospital Stay: Payer: Medicare Other

## 2015-09-14 ENCOUNTER — Encounter: Payer: Self-pay | Admitting: Internal Medicine

## 2015-09-14 ENCOUNTER — Inpatient Hospital Stay (HOSPITAL_COMMUNITY)
Admit: 2015-09-14 | Discharge: 2015-09-14 | Disposition: A | Discharge status: Home / Self Care | Payer: Medicare Other | Attending: Internal Medicine | Admitting: Internal Medicine

## 2015-09-14 DIAGNOSIS — C61 Malignant neoplasm of prostate: Secondary | ICD-10-CM

## 2015-09-14 DIAGNOSIS — I509 Heart failure, unspecified: Secondary | ICD-10-CM | POA: Diagnosis not present

## 2015-09-14 DIAGNOSIS — C3490 Malignant neoplasm of unspecified part of unspecified bronchus or lung: Secondary | ICD-10-CM | POA: Diagnosis not present

## 2015-09-14 DIAGNOSIS — R778 Other specified abnormalities of plasma proteins: Secondary | ICD-10-CM | POA: Diagnosis present

## 2015-09-14 DIAGNOSIS — M1990 Unspecified osteoarthritis, unspecified site: Secondary | ICD-10-CM

## 2015-09-14 DIAGNOSIS — R000 Tachycardia, unspecified: Secondary | ICD-10-CM | POA: Diagnosis present

## 2015-09-14 DIAGNOSIS — E119 Type 2 diabetes mellitus without complications: Secondary | ICD-10-CM

## 2015-09-14 DIAGNOSIS — Z515 Encounter for palliative care: Secondary | ICD-10-CM

## 2015-09-14 DIAGNOSIS — R0600 Dyspnea, unspecified: Secondary | ICD-10-CM | POA: Insufficient documentation

## 2015-09-14 DIAGNOSIS — I429 Cardiomyopathy, unspecified: Secondary | ICD-10-CM

## 2015-09-14 DIAGNOSIS — J9620 Acute and chronic respiratory failure, unspecified whether with hypoxia or hypercapnia: Secondary | ICD-10-CM | POA: Diagnosis not present

## 2015-09-14 DIAGNOSIS — J449 Chronic obstructive pulmonary disease, unspecified: Secondary | ICD-10-CM

## 2015-09-14 DIAGNOSIS — R531 Weakness: Secondary | ICD-10-CM

## 2015-09-14 DIAGNOSIS — Z87891 Personal history of nicotine dependence: Secondary | ICD-10-CM | POA: Diagnosis not present

## 2015-09-14 DIAGNOSIS — J439 Emphysema, unspecified: Secondary | ICD-10-CM

## 2015-09-14 DIAGNOSIS — J189 Pneumonia, unspecified organism: Secondary | ICD-10-CM | POA: Diagnosis present

## 2015-09-14 DIAGNOSIS — I10 Essential (primary) hypertension: Secondary | ICD-10-CM | POA: Diagnosis not present

## 2015-09-14 DIAGNOSIS — N189 Chronic kidney disease, unspecified: Secondary | ICD-10-CM

## 2015-09-14 DIAGNOSIS — Z7982 Long term (current) use of aspirin: Secondary | ICD-10-CM | POA: Diagnosis not present

## 2015-09-14 DIAGNOSIS — I428 Other cardiomyopathies: Secondary | ICD-10-CM | POA: Insufficient documentation

## 2015-09-14 DIAGNOSIS — D509 Iron deficiency anemia, unspecified: Secondary | ICD-10-CM | POA: Diagnosis present

## 2015-09-14 DIAGNOSIS — E1122 Type 2 diabetes mellitus with diabetic chronic kidney disease: Secondary | ICD-10-CM | POA: Diagnosis present

## 2015-09-14 DIAGNOSIS — Z66 Do not resuscitate: Secondary | ICD-10-CM

## 2015-09-14 DIAGNOSIS — R600 Localized edema: Secondary | ICD-10-CM | POA: Insufficient documentation

## 2015-09-14 DIAGNOSIS — R7989 Other specified abnormal findings of blood chemistry: Secondary | ICD-10-CM

## 2015-09-14 DIAGNOSIS — J432 Centrilobular emphysema: Secondary | ICD-10-CM | POA: Diagnosis not present

## 2015-09-14 DIAGNOSIS — J8410 Pulmonary fibrosis, unspecified: Secondary | ICD-10-CM

## 2015-09-14 DIAGNOSIS — J440 Chronic obstructive pulmonary disease with acute lower respiratory infection: Secondary | ICD-10-CM | POA: Diagnosis present

## 2015-09-14 DIAGNOSIS — Z9119 Patient's noncompliance with other medical treatment and regimen: Secondary | ICD-10-CM | POA: Diagnosis not present

## 2015-09-14 DIAGNOSIS — I248 Other forms of acute ischemic heart disease: Secondary | ICD-10-CM | POA: Diagnosis present

## 2015-09-14 DIAGNOSIS — D696 Thrombocytopenia, unspecified: Secondary | ICD-10-CM | POA: Diagnosis present

## 2015-09-14 DIAGNOSIS — Z8546 Personal history of malignant neoplasm of prostate: Secondary | ICD-10-CM | POA: Diagnosis not present

## 2015-09-14 DIAGNOSIS — I5023 Acute on chronic systolic (congestive) heart failure: Secondary | ICD-10-CM | POA: Diagnosis not present

## 2015-09-14 DIAGNOSIS — I350 Nonrheumatic aortic (valve) stenosis: Secondary | ICD-10-CM

## 2015-09-14 DIAGNOSIS — E785 Hyperlipidemia, unspecified: Secondary | ICD-10-CM | POA: Diagnosis present

## 2015-09-14 DIAGNOSIS — R0602 Shortness of breath: Secondary | ICD-10-CM | POA: Diagnosis present

## 2015-09-14 DIAGNOSIS — I129 Hypertensive chronic kidney disease with stage 1 through stage 4 chronic kidney disease, or unspecified chronic kidney disease: Secondary | ICD-10-CM

## 2015-09-14 DIAGNOSIS — N390 Urinary tract infection, site not specified: Secondary | ICD-10-CM

## 2015-09-14 DIAGNOSIS — J9621 Acute and chronic respiratory failure with hypoxia: Secondary | ICD-10-CM | POA: Diagnosis present

## 2015-09-14 DIAGNOSIS — N183 Chronic kidney disease, stage 3 (moderate): Secondary | ICD-10-CM | POA: Diagnosis present

## 2015-09-14 DIAGNOSIS — A419 Sepsis, unspecified organism: Secondary | ICD-10-CM

## 2015-09-14 DIAGNOSIS — C3431 Malignant neoplasm of lower lobe, right bronchus or lung: Secondary | ICD-10-CM | POA: Diagnosis present

## 2015-09-14 DIAGNOSIS — I130 Hypertensive heart and chronic kidney disease with heart failure and stage 1 through stage 4 chronic kidney disease, or unspecified chronic kidney disease: Secondary | ICD-10-CM | POA: Diagnosis present

## 2015-09-14 DIAGNOSIS — C3492 Malignant neoplasm of unspecified part of left bronchus or lung: Secondary | ICD-10-CM

## 2015-09-14 DIAGNOSIS — Z923 Personal history of irradiation: Secondary | ICD-10-CM | POA: Diagnosis not present

## 2015-09-14 DIAGNOSIS — J9600 Acute respiratory failure, unspecified whether with hypoxia or hypercapnia: Secondary | ICD-10-CM | POA: Diagnosis not present

## 2015-09-14 LAB — URINALYSIS COMPLETE WITH MICROSCOPIC (ARMC ONLY)
BACTERIA UA: NONE SEEN
BILIRUBIN URINE: NEGATIVE
Glucose, UA: NEGATIVE mg/dL
KETONES UR: NEGATIVE mg/dL
NITRITE: NEGATIVE
Protein, ur: 100 mg/dL — AB
SPECIFIC GRAVITY, URINE: 1.011 (ref 1.005–1.030)
pH: 5 (ref 5.0–8.0)

## 2015-09-14 LAB — TROPONIN I: TROPONIN I: 0.09 ng/mL — AB (ref <0.031)

## 2015-09-14 LAB — BLOOD GAS, ARTERIAL
ACID-BASE EXCESS: 0.1 mmol/L (ref 0.0–3.0)
BICARBONATE: 24.7 meq/L (ref 21.0–28.0)
Delivery systems: POSITIVE
Expiratory PAP: 6
FIO2: 45
Inspiratory PAP: 15
O2 SAT: 99.3 %
PATIENT TEMPERATURE: 37
PCO2 ART: 39 mmHg (ref 32.0–48.0)
PH ART: 7.41 (ref 7.350–7.450)
RATE: 10 resp/min
pO2, Arterial: 149 mmHg — ABNORMAL HIGH (ref 83.0–108.0)

## 2015-09-14 LAB — BASIC METABOLIC PANEL
Anion gap: 10 (ref 5–15)
BUN: 23 mg/dL — AB (ref 6–20)
CALCIUM: 9 mg/dL (ref 8.9–10.3)
CO2: 27 mmol/L (ref 22–32)
CREATININE: 1.44 mg/dL — AB (ref 0.61–1.24)
Chloride: 102 mmol/L (ref 101–111)
GFR calc Af Amer: 53 mL/min — ABNORMAL LOW (ref >60)
GFR, EST NON AFRICAN AMERICAN: 45 mL/min — AB (ref >60)
GLUCOSE: 155 mg/dL — AB (ref 65–99)
Potassium: 3.7 mmol/L (ref 3.5–5.1)
Sodium: 139 mmol/L (ref 135–145)

## 2015-09-14 LAB — GLUCOSE, CAPILLARY
GLUCOSE-CAPILLARY: 226 mg/dL — AB (ref 65–99)
Glucose-Capillary: 151 mg/dL — ABNORMAL HIGH (ref 65–99)
Glucose-Capillary: 152 mg/dL — ABNORMAL HIGH (ref 65–99)

## 2015-09-14 LAB — CBC
HEMATOCRIT: 41.6 % (ref 40.0–52.0)
Hemoglobin: 13.7 g/dL (ref 13.0–18.0)
MCH: 28.9 pg (ref 26.0–34.0)
MCHC: 32.9 g/dL (ref 32.0–36.0)
MCV: 87.9 fL (ref 80.0–100.0)
Platelets: 106 10*3/uL — ABNORMAL LOW (ref 150–440)
RBC: 4.73 MIL/uL (ref 4.40–5.90)
RDW: 19.4 % — AB (ref 11.5–14.5)
WBC: 5.3 10*3/uL (ref 3.8–10.6)

## 2015-09-14 LAB — MRSA PCR SCREENING: MRSA by PCR: POSITIVE — AB

## 2015-09-14 LAB — BRAIN NATRIURETIC PEPTIDE: B Natriuretic Peptide: >4500 pg/mL — ABNORMAL HIGH (ref 0.0–100.0)

## 2015-09-14 MED ORDER — IPRATROPIUM-ALBUTEROL 0.5-2.5 (3) MG/3ML IN SOLN
3.0000 mL | RESPIRATORY_TRACT | Status: DC
  Administered 2015-09-14 – 2015-09-15 (×4): 3 mL via RESPIRATORY_TRACT
  Filled 2015-09-14 (×5): qty 3

## 2015-09-14 MED ORDER — B COMPLEX PO TABS: 1.0000 | ORAL_TABLET | Freq: Every day | ORAL | Status: DC

## 2015-09-14 MED ORDER — ASPIRIN EC 81 MG PO TBEC: 81.0000 mg | DELAYED_RELEASE_TABLET | Freq: Every day | ORAL | Status: DC

## 2015-09-14 MED ORDER — SODIUM CHLORIDE 0.9 % IJ SOLN: 10.0000 mL | Freq: Two times a day (BID) | INTRAMUSCULAR | Status: DC

## 2015-09-14 MED ORDER — FUROSEMIDE 10 MG/ML IJ SOLN
20.0000 mg | Freq: Once | INTRAMUSCULAR | Status: AC
Start: 2015-09-14 — End: 2015-09-14
  Administered 2015-09-14: 20 mg via INTRAVENOUS

## 2015-09-14 MED ORDER — BUDESONIDE 0.5 MG/2ML IN SUSP
0.5000 mg | Freq: Two times a day (BID) | RESPIRATORY_TRACT | Status: DC
  Administered 2015-09-15: 0.5 mg via RESPIRATORY_TRACT
  Filled 2015-09-14 (×2): qty 2

## 2015-09-14 MED ORDER — CHLORHEXIDINE GLUCONATE 0.12 % MT SOLN
15.0000 mL | Freq: Two times a day (BID) | OROMUCOSAL | Status: DC
  Filled 2015-09-14: qty 15

## 2015-09-14 MED ORDER — TRAZODONE HCL 50 MG PO TABS: 50.0000 mg | ORAL_TABLET | Freq: Every day | ORAL | Status: DC

## 2015-09-14 MED ORDER — BISACODYL 10 MG RE SUPP: 10.0000 mg | Freq: Every day | RECTAL | Status: DC | PRN

## 2015-09-14 MED ORDER — CHLORHEXIDINE GLUCONATE CLOTH 2 % EX PADS: 6.0000 | MEDICATED_PAD | Freq: Every day | CUTANEOUS | Status: DC

## 2015-09-14 MED ORDER — AMIODARONE HCL IN DEXTROSE 360-4.14 MG/200ML-% IV SOLN
30.0000 mg/h | INTRAVENOUS | Status: DC
  Filled 2015-09-14 (×4): qty 200

## 2015-09-14 MED ORDER — METOPROLOL SUCCINATE ER 50 MG PO TB24: 200.0000 mg | ORAL_TABLET | Freq: Every day | ORAL | Status: DC

## 2015-09-14 MED ORDER — TIOTROPIUM BROMIDE MONOHYDRATE 18 MCG IN CAPS
18.0000 ug | ORAL_CAPSULE | Freq: Every day | RESPIRATORY_TRACT | Status: DC
  Filled 2015-09-14: qty 5

## 2015-09-14 MED ORDER — ASPIRIN EC 325 MG PO TBEC
325.0000 mg | DELAYED_RELEASE_TABLET | Freq: Every day | ORAL | Status: DC
  Administered 2015-09-14: 325 mg via ORAL
  Filled 2015-09-14: qty 1

## 2015-09-14 MED ORDER — AMIODARONE LOAD VIA INFUSION
150.0000 mg | Freq: Once | INTRAVENOUS | Status: AC
  Administered 2015-09-14: 150 mg via INTRAVENOUS
  Filled 2015-09-14: qty 83.34

## 2015-09-14 MED ORDER — FERROUS SULFATE 325 (65 FE) MG PO TABS: 325.0000 mg | ORAL_TABLET | Freq: Every day | ORAL | Status: DC

## 2015-09-14 MED ORDER — TIMOLOL HEMIHYDRATE 0.25 % OP SOLN
1.0000 [drp] | Freq: Every day | OPHTHALMIC | Status: DC
  Administered 2015-09-14: 1 [drp] via OPHTHALMIC
  Filled 2015-09-14: qty 1

## 2015-09-14 MED ORDER — MORPHINE 100MG IN NS 100ML (1MG/ML) PREMIX INFUSION
8.0000 mg/h | INTRAVENOUS | Status: DC
  Administered 2015-09-14: 2 mg/h via INTRAVENOUS
  Filled 2015-09-14: qty 100

## 2015-09-14 MED ORDER — SODIUM CHLORIDE 0.9 % IJ SOLN: 3.0000 mL | INTRAMUSCULAR | Status: DC | PRN

## 2015-09-14 MED ORDER — BUDESONIDE-FORMOTEROL FUMARATE 160-4.5 MCG/ACT IN AERO
2.0000 | INHALATION_SPRAY | Freq: Two times a day (BID) | RESPIRATORY_TRACT | Status: DC
  Administered 2015-09-14: 2 via RESPIRATORY_TRACT
  Filled 2015-09-14: qty 6

## 2015-09-14 MED ORDER — LISINOPRIL 20 MG PO TABS: 40.0000 mg | ORAL_TABLET | Freq: Every day | ORAL | Status: DC

## 2015-09-14 MED ORDER — SODIUM CHLORIDE 0.9 % IJ SOLN: 3.0000 mL | Freq: Two times a day (BID) | INTRAMUSCULAR | Status: DC

## 2015-09-14 MED ORDER — FUROSEMIDE 10 MG/ML IJ SOLN
INTRAMUSCULAR | Status: AC
  Filled 2015-09-14: qty 2

## 2015-09-14 MED ORDER — SODIUM CHLORIDE 0.9 % IV SOLN: 250.0000 mL | INTRAVENOUS | Status: DC | PRN

## 2015-09-14 MED ORDER — ACETAMINOPHEN 325 MG PO TABS: 650.0000 mg | ORAL_TABLET | Freq: Four times a day (QID) | ORAL | Status: DC | PRN

## 2015-09-14 MED ORDER — MORPHINE SULFATE (PF) 2 MG/ML IV SOLN
2.0000 mg | INTRAVENOUS | Status: DC | PRN
  Filled 2015-09-14: qty 2

## 2015-09-14 MED ORDER — MORPHINE SULFATE (PF) 2 MG/ML IV SOLN
2.0000 mg | Freq: Once | INTRAVENOUS | Status: AC
  Administered 2015-09-14: 2 mg via INTRAVENOUS
  Filled 2015-09-14: qty 1

## 2015-09-14 MED ORDER — ATORVASTATIN CALCIUM 20 MG PO TABS: 60.0000 mg | ORAL_TABLET | Freq: Every day | ORAL | Status: DC

## 2015-09-14 MED ORDER — ACETAMINOPHEN 650 MG RE SUPP: 650.0000 mg | Freq: Four times a day (QID) | RECTAL | Status: DC | PRN

## 2015-09-14 MED ORDER — ONDANSETRON HCL 4 MG PO TABS: 4.0000 mg | ORAL_TABLET | Freq: Four times a day (QID) | ORAL | Status: DC | PRN

## 2015-09-14 MED ORDER — VANCOMYCIN HCL IN DEXTROSE 1-5 GM/200ML-% IV SOLN
1000.0000 mg | INTRAVENOUS | Status: AC
  Administered 2015-09-14: 1000 mg via INTRAVENOUS
  Filled 2015-09-14: qty 200

## 2015-09-14 MED ORDER — CETYLPYRIDINIUM CHLORIDE 0.05 % MT LIQD
7.0000 mL | Freq: Two times a day (BID) | OROMUCOSAL | Status: DC
  Administered 2015-09-14 (×2): 7 mL via OROMUCOSAL

## 2015-09-14 MED ORDER — ONDANSETRON HCL 4 MG/2ML IJ SOLN: 4.0000 mg | Freq: Four times a day (QID) | INTRAMUSCULAR | Status: DC | PRN

## 2015-09-14 MED ORDER — VANCOMYCIN HCL IN DEXTROSE 1-5 GM/200ML-% IV SOLN
1000.0000 mg | INTRAVENOUS | Status: DC
  Filled 2015-09-14 (×2): qty 200

## 2015-09-14 MED ORDER — SODIUM CHLORIDE 0.9 % IV SOLN: 2.0000 mg/h | INTRAVENOUS | Status: DC

## 2015-09-14 MED ORDER — LORAZEPAM 2 MG/ML IJ SOLN
0.5000 mg | INTRAMUSCULAR | Status: DC | PRN
  Administered 2015-09-14 – 2015-09-15 (×2): 0.5 mg via INTRAVENOUS
  Filled 2015-09-14 (×3): qty 1

## 2015-09-14 MED ORDER — MUPIROCIN 2 % EX OINT
1.0000 "application " | TOPICAL_OINTMENT | Freq: Two times a day (BID) | CUTANEOUS | Status: DC
  Administered 2015-09-14: 1 via NASAL
  Filled 2015-09-14: qty 22

## 2015-09-14 MED ORDER — PROCHLORPERAZINE 25 MG RE SUPP
25.0000 mg | Freq: Two times a day (BID) | RECTAL | Status: DC | PRN
  Filled 2015-09-14: qty 1

## 2015-09-14 MED ORDER — MORPHINE SULFATE (PF) 4 MG/ML IV SOLN
4.0000 mg | Freq: Once | INTRAVENOUS | Status: AC
  Administered 2015-09-14: 4 mg via INTRAVENOUS

## 2015-09-14 MED ORDER — SODIUM CHLORIDE 0.9 % IJ SOLN: 10.0000 mL | INTRAMUSCULAR | Status: DC | PRN

## 2015-09-14 MED ORDER — MORPHINE SULFATE (PF) 2 MG/ML IV SOLN
0.5000 mg | Freq: Once | INTRAVENOUS | Status: AC
  Administered 2015-09-14: 0.5 mg via INTRAVENOUS

## 2015-09-14 MED ORDER — INSULIN ASPART 100 UNIT/ML ~~LOC~~ SOLN
0.0000 [IU] | Freq: Three times a day (TID) | SUBCUTANEOUS | Status: DC
  Administered 2015-09-14: 2 [IU] via SUBCUTANEOUS
  Administered 2015-09-14: 3 [IU] via SUBCUTANEOUS
  Filled 2015-09-14: qty 2
  Filled 2015-09-14: qty 3

## 2015-09-14 MED ORDER — IPRATROPIUM-ALBUTEROL 0.5-2.5 (3) MG/3ML IN SOLN
3.0000 mL | Freq: Four times a day (QID) | RESPIRATORY_TRACT | Status: DC
  Administered 2015-09-14 (×2): 3 mL via RESPIRATORY_TRACT
  Filled 2015-09-14 (×2): qty 3

## 2015-09-14 MED ORDER — METHYLPREDNISOLONE SODIUM SUCC 40 MG IJ SOLR
40.0000 mg | Freq: Two times a day (BID) | INTRAMUSCULAR | Status: DC
  Administered 2015-09-14: 40 mg via INTRAVENOUS
  Filled 2015-09-14: qty 1

## 2015-09-14 MED ORDER — SENNOSIDES-DOCUSATE SODIUM 8.6-50 MG PO TABS: 1.0000 | ORAL_TABLET | Freq: Every day | ORAL | Status: DC

## 2015-09-14 MED ORDER — PIPERACILLIN-TAZOBACTAM 3.375 G IVPB
3.3750 g | Freq: Three times a day (TID) | INTRAVENOUS | Status: DC
  Administered 2015-09-14: 3.375 g via INTRAVENOUS
  Filled 2015-09-14 (×5): qty 50

## 2015-09-14 MED ORDER — MORPHINE SULFATE (PF) 2 MG/ML IV SOLN
INTRAVENOUS | Status: AC
  Filled 2015-09-14: qty 1

## 2015-09-14 MED ORDER — SODIUM CHLORIDE 0.9 % IJ SOLN
3.0000 mL | Freq: Two times a day (BID) | INTRAMUSCULAR | Status: DC
  Administered 2015-09-14 – 2015-09-15 (×2): 3 mL via INTRAVENOUS

## 2015-09-14 MED ORDER — AMIODARONE HCL IN DEXTROSE 360-4.14 MG/200ML-% IV SOLN
60.0000 mg/h | INTRAVENOUS | Status: DC
  Administered 2015-09-14 (×2): 60 mg/h via INTRAVENOUS
  Filled 2015-09-14 (×2): qty 200

## 2015-09-14 MED ORDER — ADULT MULTIVITAMIN W/MINERALS CH
ORAL_TABLET | Freq: Every day | ORAL | Status: DC
  Administered 2015-09-14: 1 via ORAL
  Filled 2015-09-14: qty 1

## 2015-09-14 MED ORDER — LATANOPROST 0.005 % OP SOLN
1.0000 [drp] | Freq: Every day | OPHTHALMIC | Status: DC
Start: 2015-09-14 — End: 2015-09-14
  Filled 2015-09-14: qty 2.5

## 2015-09-14 MED ORDER — RENA-VITE PO TABS: 1.0000 | ORAL_TABLET | Freq: Every day | ORAL | Status: DC

## 2015-09-14 MED ORDER — HEPARIN SODIUM (PORCINE) 5000 UNIT/ML IJ SOLN
5000.0000 [IU] | Freq: Three times a day (TID) | INTRAMUSCULAR | Status: DC
  Administered 2015-09-14 (×2): 5000 [IU] via SUBCUTANEOUS
  Filled 2015-09-14 (×2): qty 1

## 2015-09-14 MED ORDER — POTASSIUM CHLORIDE CRYS ER 20 MEQ PO TBCR
20.0000 meq | EXTENDED_RELEASE_TABLET | Freq: Every day | ORAL | Status: DC
  Administered 2015-09-14: 20 meq via ORAL
  Filled 2015-09-14: qty 1

## 2015-09-14 MED ORDER — FUROSEMIDE 10 MG/ML IJ SOLN
40.0000 mg | Freq: Two times a day (BID) | INTRAMUSCULAR | Status: DC
  Administered 2015-09-14 – 2015-09-15 (×3): 40 mg via INTRAVENOUS
  Filled 2015-09-14 (×3): qty 4

## 2015-09-14 NOTE — Consult Note (Signed)
Palliative Medicine Inpatient Consult Note   Name: Gabriel Gregory Date: 09/14/2015 MRN: [id number]  DOB: Apr 11, 1936  Referring Physician: [name], MD  Palliative Care consult requested for this 79 y.o. male for goals of medical therapy in patient with acute respiratory failure due to multiple causes. While I was in the stepdown unit seeing another pt, I was asked by Gabriel Gregory to see pt and talk with family as soon as possible, since pt was deteriorating and did not want intubation.    -------------------------------------------------------------------------------------------  TODAY'S DISCUSSIONS AND DECISIONS: 1.  Pts code status is reviewed again and it is to remain as DNR.  2.  I have met (twice) with family.  All immediate family members present and / or on the phone agree with comfort care only.  I let them know we would do a '[place]' plan where certain meds and BIPAP would remain. Also they want him to stay in ICU if possible tonight and then we will discuss transfer tomorrow (if he is still alive --as daughter says).    3.  Pt wants to stay up in the bedside chair. He is to be on a low -dose morphine drip --nursing is concerned about someone being in the room with him (for safety reasons).  Family was asked about [place] being able to move him to the bed, and they want him to stay in the chair for now.  Nursing has made family aware that this is a risk but they adamantly want him to stay in the chair.  I have talked with them and they feel he needs to be VERY comfortable before any moving to the bed is attempted --and he is 'not there yet'.  4.  Pt requires symptom management for air hunger. Morphine drip is started at low dose of 42m/ hr.  Pt has two daughters who are nurses (one is a [place]chandiser, retailin another [place]).  All are in agreement with a low dose and we/ nursing will talk with them if we need to increase the dose.  Other symptom meds are ordered.  Constipation may  become a concern soon.  5.  Pt's wife wants cardiac monitoring to stay.  For now.  Hence, [place] some [place] remain (resp meds/ nebs and monitoring order).  We will revisit this tomorrow.  Family not interested in discussing moving him out of the hospital anywhere --they just want him not suffering now and here and then we Gregory talk more later.  6.  Labs, CBGs, certain meds (Amio), glaucoma eye drops, etc are DCd. Family aware I am DCing most meds not related to comfort. ABX are DCd --he has probably had adequate or nearly adequate ABX for the UTI/ sepsis from UTI --but this is not a definite. Family aware.    7.  Its not clear if [place] be [facility] [place] [facility] [place] at this time. I will discuss this and other matters as we continuously reassess pts condition and what is best for him.  Family did not want to 'get into that yet' --their main focus was making pt comfortable right now --and we Gregory talk more later.    IMPRESSION: Acute on chronic respiratory failure  --due to multiple causes including left lung cancer (with last radiation done yesterday), COPD, CHF (unclear type with echo done but results pending) Prostate Cancer ---originally diagnosed many years ago and now with increasing recent PSA --so  Gabriel Gregory was seeing pt for LUPRON injections q 65mo. HTN DM2 Weakness H/O Aoritic Aneurysm Some degree of Aortic Stenosis Pulmonary Fibrosis (degree unclear) CKD DJD Former smoker Iron Deficiency Anemia Dyslipidemia   -----------------------------------------------------------------------------------------------  REVIEW OF SYSTEMS:  Patient is not able to provide ROS due to critical illness  SPIRITUAL SUPPORT SYSTEM: Yes  --very involved and supportive family.  SOCIAL HISTORY:  reports that he quit smoking about 5 months ago. He does not have any smokeless tobacco history on file. He reports that he does not drink  alcohol.  LEGAL DOCUMENTS:  I placed a DNR form in paper chart.   CODE STATUS: DNR  --[facility] so Dr. KMortimer Frieshas made pt's code status DNR after consulting with family.    PAST MEDICAL HISTORY: Past Medical History  Diagnosis Date  . COPD (chronic obstructive pulmonary disease) ([place]   . CHF (congestive heart failure) ([place]   . Diabetes mellitus without complication ([place]   . Hypertension   . Pulmonary fibrosis ([place]   . Renal disorder   . Aortic aneurysm ([place]   . Prostate cancer ([place]   . Lung cancer ([place]     PAST SURGICAL HISTORY:  Past Surgical History  Procedure Laterality Date  . Back surgery    . Prostate surgery      ALLERGIES:  is allergic to bee venom; felodipine; and simvastatin.  MEDICATIONS:  Current Facility-Administered Medications  Medication Dose Route Frequency Provider Last Rate Last Dose  . acetaminophen (TYLENOL) suppository 650 mg  650 mg Rectal Q6H PRN Gabriel Mire MD      . bisacodyl (DULCOLAX) suppository 10 mg  10 mg Rectal Daily PRN Gabriel Can MD      . budesonide (PULMICORT) nebulizer solution 0.5 mg  0.5 mg Nebulization BID Gabriel Lipps MD   0.5 mg at 09/14/15 1130  . budesonide-formoterol (SYMBICORT) 160-4.5 MCG/ACT inhaler 2 puff  2 puff Inhalation BID Gabriel Mire MD   2 puff at 09/14/15 0800  . furosemide (LASIX) 10 MG/ML injection           . furosemide (LASIX) injection 40 mg  40 mg Intravenous BID Gabriel Mire MD   40 mg at 09/14/15 0600  . ipratropium-albuterol (DUONEB) 0.5-2.5 (3) MG/3ML nebulizer solution 3 mL  3 mL Nebulization Q4H Gabriel Lipps MD   3 mL at 09/14/15 1557  . LORazepam (ATIVAN) injection 0.5 mg  0.5 mg Intravenous Q4H PRN Gabriel Can MD      . morphine 1089min NS 10069m1mg72m) infusion - premix  10 mg/hr Intravenous Continuous Gabriel Gregory      . morphine 2 MG/ML injection        2 mg at 09/14/15 1730  . ondansetron (ZOFRAN) injection 4 mg  4  mg Intravenous Q6H PRN Gabriel Gregory      . prochlorperazine (COMPAZINE) suppository 25 mg  25 mg Rectal Q12H PRN Gabriel Gregory      . senna-docusate (Senokot-S) tablet 1 tablet  1 tablet Oral QHS Gabriel Gregory      . sodium chloride 0.9 % injection 10-40 mL  10-40 mL Intracatheter PRN Gabriel Gregory      . sodium chloride 0.9 % injection 3 mL  3 mL Intravenous Q12H Gabriel Gregory   3 mL at 09/14/15 0554  . tiotropium (SPIRIVA) inhalation capsule 18 mcg  18  mcg Inhalation Daily [name], MD   18 mcg at 09/14/15 0800    Vital Signs: BP 105/85 mmHg  Pulse 83  Temp(Src) 98.8 F (37.1 C) (Oral)  Resp 25  Ht 5' 6"  (1.676 m)  Wt 97.387 kg (214 lb 11.2 oz)  BMI 34.67 kg/m2  SpO2 98% Filed Weights   09/06/2015 2224 09/14/15 0313  Weight: 95.255 kg (210 lb) 97.387 kg (214 lb 11.2 oz)    Estimated body mass index is 34.67 kg/(m^2) as calculated from the following:   Height as of this encounter: 5' 6"  (1.676 m).   Weight as of this encounter: 97.387 kg (214 lb 11.2 oz).  PERFORMANCE STATUS (ECOG) : 4 - Bedbound  PHYSICAL EXAM: Severe distress up in chair on BIPAP with head leaning forward and pt unable to talk due to distress Eyes closed --does not/ cannot talk to me Neck --unable to see Hrt rrr no mgr Lungs        LABS: CBC:    Component Value Date/Time   WBC 5.3 09/14/2015 0527   WBC 8.2 07/09/2013 1012   HGB 13.7 09/14/2015 0527   HGB 13.2 07/09/2013 1012   HCT 41.6 09/14/2015 0527   HCT 40.3 07/09/2013 1012   PLT 106* 09/14/2015 0527   PLT 172 07/09/2013 1012   MCV 87.9 09/14/2015 0527   MCV 85 07/09/2013 1012   NEUTROABS 5.1 09/08/2015 1134   NEUTROABS 5.9 07/09/2013 1012   LYMPHSABS 0.3* 09/25/2015 1134   LYMPHSABS 1.4 07/09/2013 1012   MONOABS 0.4 09/20/2015 1134   MONOABS 0.5 07/09/2013 1012   EOSABS 0.0 09/22/2015 1134   EOSABS 0.3 07/09/2013 1012   BASOSABS 0.0 09/28/2015 1134   BASOSABS 0.1 07/09/2013 1012    Comprehensive Metabolic Panel:    Component Value Date/Time   NA 139 09/14/2015 0527   NA 142 07/09/2013 1012   K 3.7 09/14/2015 0527   K 3.5 07/09/2013 1012   CL 102 09/14/2015 0527   CL 103 07/09/2013 1012   CO2 27 09/14/2015 0527   CO2 27 07/09/2013 1012   BUN 23* 09/14/2015 0527   BUN 15 07/09/2013 1012   CREATININE 1.44* 09/14/2015 0527   CREATININE 1.40* 07/09/2013 1012   GLUCOSE 155* 09/14/2015 0527   GLUCOSE 124* 07/09/2013 1012   CALCIUM 9.0 09/14/2015 0527   CALCIUM 8.6 07/09/2013 1012   AST 72* 09/09/2015 2319   AST 20 05/30/2013 0931   ALT 66* 09/07/2015 2319   ALT 19 05/30/2013 0931   ALKPHOS 112 09/03/2015 2319   ALKPHOS 98 05/30/2013 0931   BILITOT 1.9* 09/07/2015 2319   BILITOT 0.5 05/30/2013 0931   PROT 8.1 09/17/2015 2319   PROT 8.3* 05/30/2013 0931   ALBUMIN 3.3* 09/24/2015 2319   ALBUMIN 3.2* 05/30/2013 0931    More than 50% of the visit was spent in counseling/coordination of care: Yes  Time Spent: 80 minutes

## 2015-09-14 NOTE — Progress Notes (Signed)
PT Cancellation Note  Patient Details Name: Gabriel Gregory MRN: [id number] DOB: 12/16/1935   Cancelled Treatment:    Reason Eval/Treat Not Completed: Medical issues which prohibited therapy. Patient currently on BiPap and IV amioderone with low recent readings for HR. Given the above and his current clinical picture, mobility evaluation will be held until patient is more medically stable and appropriate for OOB mobility.   Kerman Passey, PT, DPT    09/14/2015, 10:22 AM

## 2015-09-14 NOTE — Progress Notes (Signed)
Pt very upset about using BSC for BM.  Pt weak and on 3 L [place] and the importance of safety was stressed to pt-- pt agreeable to [facility] for now. Jessee Avers

## 2015-09-14 NOTE — Progress Notes (Signed)
Pt uncomfortable sitting up in bed.  Pt requesting to sit on the side of the bed.  Pt at times rests his head on the bedside table.  Pt urged to sit up in recliner or get back to bed for safety.  Pt refuses- he says he is all right. Pt tachypnic- 8am dose of lasix given early to help with his breaking/comfort.  Will continue to monitor. Jessee Avers

## 2015-09-14 NOTE — Progress Notes (Signed)
Pt still working to breath,  MD, Dr. Posey Pronto notified.  MD ordered to give 20 mg more of Lasix; 0.5 mg IV of morphine.  Will call respiratory Jessee Avers

## 2015-09-14 NOTE — Progress Notes (Signed)
Brought to Dr. Eliezer Lofts attention that morphin order '10mg'$ /hr. MD aware and is in the process of correcting order.

## 2015-09-14 NOTE — Care Management (Signed)
Patient was admitted to 2A for shortness of breath and required transferred to icu this morning due to increased respiratory difficulty requiring continuous bipap.  Patient is currrently undergoing radiation treatment for lung cancer.  CXR shows congestive heart failure. Patient is experiencing too much respiratory difficulty to speak with care manager.

## 2015-09-14 NOTE — Consult Note (Signed)
[facility] Consultation      Name: Gabriel Gregory MRN: [id number] DOB: 07-Mar-1936    ADMISSION DATE:  09/12/2015 Consult Physician: Dr. Posey Pronto   CHIEF COMPLAINT:  SOB   HISTORY OF PRESENT ILLNESS  79 y.o. male with a known history of lung cancer undergoing radiation treatment, hypertension, COPD, diabetes mellitus type 2, history of congestive heart failure presents with the complaints of ongoing shortness of breath with increased pedal edema for the past 2 weeks which further worsening SOB/DOE.   Patient on biPAP, increased WOB, using accessory muscles  ER report: revealed respiratory distress with respiratory rate of 24 and room air O2 saturations in the upper 80s but stable blood pressure. Patient was placed on O2 supplementation through nasal cannula. Lab work revealed elevated BNP of more than 4500, BUN/creatinine 23/1.54, troponin 0.08, AST/ALT 77/66. CBC within normal limits except for platelets of 112 K. Chest x-ray reported as congestive heart failure. EKG sinus tachycardia with ventricular rate of 99 bpm, nonspecific ST-T abnormality. Patient was given 40 mg of IV furosemide and Foley catheterization was placed in the ED.   Patient was given several doses of lasix, patient transferred to ICU for worsening SOB Patient critically ill, high risk for cardiac arrest/intubation      PAST MEDICAL HISTORY    :  Past Medical History  Diagnosis Date  . COPD (chronic obstructive pulmonary disease) ([place])   . CHF (congestive heart failure) ([place])   . Diabetes mellitus without complication ([place])   . Hypertension   . Pulmonary fibrosis ([place])   . Renal disorder   . Aortic aneurysm ([place])   . Prostate cancer ([place])   . Lung cancer [facility])    Past Surgical History  Procedure Laterality Date  . Back surgery    . Prostate surgery     Prior to Admission medications   Medication Sig Start Date End Date Taking? Authorizing Provider  ammonium lactate  (LAC-HYDRIN) 12 % lotion Apply 1 application topically as needed for dry skin.   Yes Historical Provider, MD  aspirin EC 325 MG tablet Take 325 mg by mouth daily.   Yes Historical Provider, MD  atorvastatin (LIPITOR) 40 MG tablet Take 60 mg by mouth at bedtime.   Yes Historical Provider, MD  B Complex Vitamins (B COMPLEX PO) Take 1 tablet by mouth daily.   Yes Historical Provider, MD  budesonide-formoterol (SYMBICORT) 160-4.5 MCG/ACT inhaler Inhale 2 puffs into the lungs 2 (two) times daily.   Yes Historical Provider, MD  ferrous sulfate 325 (65 FE) MG tablet Take 325 mg by mouth daily with breakfast.   Yes Historical Provider, MD  latanoprost (XALATAN) 0.005 % ophthalmic solution Administer 1 drop to the right eye nightly.   Yes Historical Provider, MD  lisinopril (PRINIVIL,ZESTRIL) 40 MG tablet Take 40 mg by mouth daily.   Yes Historical Provider, MD  metFORMIN (GLUCOPHAGE) 500 MG tablet Take 500 mg by mouth 2 (two) times daily with a meal.   Yes Historical Provider, MD  metoprolol (TOPROL-XL) 200 MG 24 hr tablet Take 200 mg by mouth daily.   Yes Historical Provider, MD  Multiple Vitamin (MULTI-VITAMINS) TABS Take by mouth.   Yes Historical Provider, MD  potassium chloride SA (K-DUR,KLOR-CON) 20 MEQ tablet Take 20 mEq by mouth daily.   Yes Historical Provider, MD  sennosides-docusate sodium (SENOKOT-S) 8.6-50 MG tablet Take 1 tablet by mouth at bedtime.   Yes Historical Provider, MD  timolol (BETIMOL) 0.25 % ophthalmic solution  Yes Historical Provider, MD  tiotropium (SPIRIVA) 18 MCG inhalation capsule Place 18 mcg into inhaler and inhale daily.   Yes Historical Provider, MD  torsemide (DEMADEX) 10 MG tablet Take 20 mg by mouth 2 (two) times daily.   Yes Historical Provider, MD  traZODone (DESYREL) 50 MG tablet 50 mg at bedtime.  07/11/15  Yes Historical Provider, MD   Allergies  Allergen Reactions  . Bee Venom Hives and Shortness Of Breath  . Felodipine Hives    Unknown reaction  .  Simvastatin Hives     FAMILY HISTORY   Family History  Problem Relation Age of Onset  . Thyroid disease Mother       SOCIAL HISTORY    reports that he quit smoking about 5 months ago. He does not have any smokeless tobacco history on file. He reports that he does not drink alcohol. His drug history is not on file.  Review of Systems  Unable to perform ROS: critical illness      VITAL SIGNS    Temp:  [97.5 F (36.4 C)-98.4 F (36.9 C)] 98.4 F (36.9 C) (12/14 0313) Pulse Rate:  [37-125] 47 (12/14 0805) Resp:  [16-30] 24 (12/14 0805) BP: (115-148)/(78-107) 122/95 mmHg (12/14 0700) SpO2:  [94 %-98 %] 98 % (12/14 0805) FiO2 (%):  [32 %-45 %] 45 % (12/14 0805) Weight:  [210 lb (95.255 kg)-214 lb 11.2 oz (97.387 kg)] 214 lb 11.2 oz (97.387 kg) (12/14 0313) HEMODYNAMICS:   VENTILATOR SETTINGS: Vent Mode:  [-]  FiO2 (%):  [32 %-45 %] 45 % INTAKE / OUTPUT:  Intake/Output Summary (Last 24 hours) at 09/14/15 0819 Last data filed at 09/14/15 0636  Gross per 24 hour  Intake     10 ml  Output    200 ml  Net   -190 ml       PHYSICAL EXAM   Physical Exam  Constitutional: He appears distressed.  HENT:  Head: Normocephalic and atraumatic.  Eyes: Conjunctivae are normal. Pupils are equal, round, and reactive to light.  Neck: Normal range of motion. Neck supple.  Cardiovascular: Normal heart sounds.   No murmur heard. Pulmonary/Chest: He is in respiratory distress. He has wheezes. He has rales.  Abdominal: Soft. Bowel sounds are normal. He exhibits no distension.  Musculoskeletal: He exhibits edema.  Neurological: No cranial nerve deficit.  Lethargic, arouses to vocal stimuli  Skin: Skin is warm. He is diaphoretic.       LABS   LABS:  CBC  Recent Labs Lab 09/25/2015 1134 09/19/2015 2319 09/14/15 0527  WBC 5.8 5.4 5.3  HGB 13.9 13.7 13.7  HCT 43.4 43.4 41.6  PLT 125* 112* 106*   Coag's No results for input(s): APTT, INR in the last 168  hours. BMET  Recent Labs Lab 09/05/2015 1134 09/10/2015 2319 09/14/15 0527  NA 137 135 139  K 3.3* 3.9 3.7  CL 98* 101 102  CO2 '28 24 27  '$ BUN 21* 23* 23*  CREATININE 1.40* 1.54* 1.44*  GLUCOSE 117* 133* 155*   Electrolytes  Recent Labs Lab 09/24/2015 1134 09/10/2015 2319 09/14/15 0527  CALCIUM 9.0 9.2 9.0   Sepsis Markers No results for input(s): LATICACIDVEN, PROCALCITON, O2SATVEN in the last 168 hours. ABG No results for input(s): PHART, PCO2ART, PO2ART in the last 168 hours. Liver Enzymes  Recent Labs Lab 09/05/2015 1134 09/03/2015 2319  AST 17 72*  ALT 21 66*  ALKPHOS 100 112  BILITOT 1.4* 1.9*  ALBUMIN 3.3* 3.3*   Cardiac Enzymes  Recent Labs Lab 09/23/2015 2319 09/14/15 0527  TROPONINI 0.08* 0.09*   Glucose  Recent Labs Lab 09/14/15 0729 09/14/15 0757  GLUCAP 151* 152*     No results found for this or any previous visit (from the past 240 hour(s)).   Current facility-administered medications:  .  acetaminophen (TYLENOL) tablet 650 mg, 650 mg, Oral, Q6H PRN **OR** acetaminophen (TYLENOL) suppository 650 mg, 650 mg, Rectal, Q6H PRN, [name], MD .  aspirin EC tablet 325 mg, 325 mg, Oral, Daily, [name], MD .  atorvastatin (LIPITOR) tablet 60 mg, 60 mg, Oral, QHS, [name], MD .  budesonide-formoterol (SYMBICORT) 160-4.5 MCG/ACT inhaler 2 puff, 2 puff, Inhalation, BID, [name], MD .  ferrous sulfate tablet 325 mg, 325 mg, Oral, Q breakfast, [name], MD .  furosemide (LASIX) 10 MG/ML injection, , , ,  .  furosemide (LASIX) injection 40 mg, 40 mg, Intravenous, BID, [name], MD, 40 mg at 09/14/15 0600 .  heparin injection 5,000 Units, 5,000 Units, Subcutaneous, 3 times per day, [name], MD, 5,000 Units at 09/14/15 [phone] .  insulin aspart (novoLOG) injection 0-9 Units, 0-9 Units, Subcutaneous, TID WC, [name], MD .  ipratropium-albuterol (DUONEB) 0.5-2.5 (3) MG/3ML nebulizer solution 3 mL, 3  mL, Nebulization, Q6H, [name], MD, 3 mL at 09/14/15 0805 .  latanoprost (XALATAN) 0.005 % ophthalmic solution 1 drop, 1 drop, Both Eyes, QHS, [name], MD .  lisinopril (PRINIVIL,ZESTRIL) tablet 40 mg, 40 mg, Oral, Daily, [name], MD .  metoprolol succinate (TOPROL-XL) 24 hr tablet 200 mg, 200 mg, Oral, Daily, [name], MD .  morphine 2 MG/ML injection, , , ,  .  multivitamin (RENA-VIT) tablet 1 tablet, 1 tablet, Oral, QHS, [name], MD .  multivitamin with minerals tablet, , Oral, Daily, [name], MD .  ondansetron (ZOFRAN) tablet 4 mg, 4 mg, Oral, Q6H PRN **OR** ondansetron (ZOFRAN) injection 4 mg, 4 mg, Intravenous, Q6H PRN, [name], MD .  potassium chloride SA (K-DUR,KLOR-CON) CR tablet 20 mEq, 20 mEq, Oral, Daily, [name], MD .  senna-docusate (Senokot-S) tablet 1 tablet, 1 tablet, Oral, QHS, [name], MD .  sodium chloride 0.9 % injection 3 mL, 3 mL, Intravenous, Q12H, [name], MD, 3 mL at 09/14/15 0554 .  timolol (BETIMOL) 0.25 % ophthalmic solution 1 drop, 1 drop, Both Eyes, Daily, [name], MD .  tiotropium [facility]) inhalation capsule 18 mcg, 18 mcg, Inhalation, Daily, [name], MD .  traZODone (DESYREL) tablet 50 mg, 50 mg, Oral, QHS, [name], MD  IMAGING    Dg Chest 1 View  09/07/2015  CLINICAL DATA:  Shortness of breath.  COPD. EXAM: CHEST 1 VIEW COMPARISON:  Chest CT of 08/09/2015.  Plain film of 06/15/2015. FINDINGS: Midline trachea. Cardiomegaly accentuated by AP portable technique. Probable small left larger than right pleural effusions. No pneumothorax. Underlying pattern of interstitial fibrosis identified. Superimposed interstitial edema, worse on the right. Bibasilar airspace disease. The known right lower lobe lung mass is not readily apparent. IMPRESSION: Congestive heart failure, superimposed upon pulmonary fibrosis. Small bilateral pleural effusions with  bibasilar airspace disease. Atelectasis or concurrent infection. Please see CT of 08/09/2015 for description of right lower lobe primary bronchogenic carcinoma. Electronically Signed   By: Abigail Miyamoto M.D.   On: 09/22/2015 23:19      Indwelling Urinary Catheter continued, requirement due to  Reason to continue Indwelling Urinary Catheter for strict Intake/Output monitoring for hemodynamic instability             MAJOR EVENTS/TEST RESULTS: Ct chest 08/2015 images reveiwed 09/14/2015 ECHO 04/2015 EF 25%-30% moderate AS/MS   INDWELLING DEVICES::  MICRO DATA: MRSA PCR >> Urine  Blood>> Resp >>  ANTIMICROBIALS:     ASSESSMENT/PLAN   79 yo AAm with lung cancer, COPD,CHF admitted to ICU for acute and severe resp failure from acute CHF/COPD exacerbation will treat for pneumonia, unable to obtain CT chest to assess for PE due to elevated creatinine  PULMONARY -Respiratory Failure -continue biPAP as tolerated-high risk for intubation -continue Bronchodilator Therapy-dounebs and pulmicort nebs COPD exacerbation-start Iv steroids   CARDIOVASCULAR Acute CHF exacerbation -follow up cardiology recs -lasix as tolerated -will start amiodarone -plan for CVL  RENAL Acute RF from ATN Follow chem 7 -follow UO -continue foley catheter  GASTROINTESTINAL Keep NPO for now  HEMATOLOGIC Follow h/h  INFECTIOUS Empiric therapy for pneumonia  ENDOCRINE - ICU hypoglycemic\Hyperglycemia protocol   NEUROLOGIC Avoid benzo's Morphine as needed    I have personally obtained a history, examined the patient, evaluated laboratory and independently reviewed  imaging results, formulated the assessment and plan and placed orders.  The Patient requires high complexity decision making for assessment and support, frequent evaluation and titration of therapies, application of advanced monitoring technologies and extensive interpretation of multiple databases. Critical Care Time devoted  to patient care services described in this note is 50 minutes.   Overall, patient is critically ill, prognosis is guarded. Patient at high risk for cardiac arrest and death.    Corrin Parker, M.D.  [facility]  Medical Director [place] Director [facility] Cardio-Pulmonary Department

## 2015-09-14 NOTE — Progress Notes (Signed)
Central line placed 11:30. Per Dr [name] consulting Pallative care. Per Dr Bridget Hartshorn held off on getting troponin level. She will first speak with family.

## 2015-09-14 NOTE — Progress Notes (Signed)
*  PRELIMINARY RESULTS* Echocardiogram 2D Echocardiogram has been performed.  Gabriel Gregory 09/14/2015, 2:36 PM

## 2015-09-14 NOTE — Consult Note (Signed)
Cardiology Consultation Note  Patient ID: Gabriel Gregory, MRN: [id number], DOB/AGE: [birth year] 79 y.o. Admit date: 09/18/2015   Date of Consult: 09/14/2015 Primary Physician: Lloyd Huger, MD Primary Cardiologist: outside cardiologist  Chief Complaint: Respiratory distress Reason for Consult: Contacted by Dr. [name] : Acute on chronic systolic CHF, leg edema, respiratory distress  HPI: 79 y.o. male with h/o  lung cancer undergoing radiation treatment, chronic systolic CHF with ejection fraction 25-30%, at least moderate aortic valve stenosis, moderate MR, hypertension, COPD, diabetes mellitus type 2, presenting to the hospital with worsening shortness of breath for the past 2 weeks.   Patient reports increasing cough, shortness of breath, leg edema over the past several weeks. Recently completed his radiation treatment for bronchogenic lung cancer, followed by Dr. [name]  Initially placed on telemetry floor with nasal cannula oxygen, started on Lasix, worsening shortness of breath overnight, increased cough. Transfer to the ICU, started on BiPAP  On arrival, he Denies any chest pain, palpitations, fever, cough, wheezing, nausea, vomiting, diarrhea, dysuria, abdominal pain. He does have generalized weakness but denies any focal weakness or numbness.  BNP more than 4500, . Chest x-ray reported as congestive heart failure.   given 40 mg  IV furosemide and Foley catheterization was placed in the ED.   Review of prior notes indicates hospital admission July 2016 for acute on chronic systolic CHF, COPD exacerbation, possible SIRS. He is mental status changes secondary to hypoxia   Past Medical History  Diagnosis Date  . COPD (chronic obstructive pulmonary disease) ([place])   . CHF (congestive heart failure) ([place])   . Diabetes mellitus without complication ([place])   . Hypertension   . Pulmonary fibrosis ([place])   . Renal disorder   . Aortic aneurysm ([place])   . Prostate cancer ([place])    . Lung cancer ([place])       Most Recent Cardiac Studies: Echo pending. Previous echocardiogram done at outside hospital showed ejection fraction 25-30%, at least moderate aortic valve stenosis, moderate MR. Full report not available    Surgical History:  Past Surgical History  Procedure Laterality Date  . Back surgery    . Prostate surgery       Home Meds: Prior to Admission medications   Medication Sig Start Date End Date Taking? Authorizing Provider  ammonium lactate (LAC-HYDRIN) 12 % lotion Apply 1 application topically as needed for dry skin.   Yes Historical Provider, MD  aspirin EC 325 MG tablet Take 325 mg by mouth daily.   Yes Historical Provider, MD  atorvastatin (LIPITOR) 40 MG tablet Take 60 mg by mouth at bedtime.   Yes Historical Provider, MD  B Complex Vitamins (B COMPLEX PO) Take 1 tablet by mouth daily.   Yes Historical Provider, MD  budesonide-formoterol (SYMBICORT) 160-4.5 MCG/ACT inhaler Inhale 2 puffs into the lungs 2 (two) times daily.   Yes Historical Provider, MD  ferrous sulfate 325 (65 FE) MG tablet Take 325 mg by mouth daily with breakfast.   Yes Historical Provider, MD  latanoprost (XALATAN) 0.005 % ophthalmic solution Administer 1 drop to the right eye nightly.   Yes Historical Provider, MD  lisinopril (PRINIVIL,ZESTRIL) 40 MG tablet Take 40 mg by mouth daily.   Yes Historical Provider, MD  metFORMIN (GLUCOPHAGE) 500 MG tablet Take 500 mg by mouth 2 (two) times daily with a meal.   Yes Historical Provider, MD  metoprolol (TOPROL-XL) 200 MG 24 hr tablet Take 200 mg by mouth daily.   Yes Historical Provider,  MD  Multiple Vitamin (MULTI-VITAMINS) TABS Take by mouth.   Yes Historical Provider, MD  potassium chloride SA (K-DUR,KLOR-CON) 20 MEQ tablet Take 20 mEq by mouth daily.   Yes Historical Provider, MD  sennosides-docusate sodium (SENOKOT-S) 8.6-50 MG tablet Take 1 tablet by mouth at bedtime.   Yes Historical Provider, MD  timolol (BETIMOL) 0.25 % ophthalmic  solution    Yes Historical Provider, MD  tiotropium (SPIRIVA) 18 MCG inhalation capsule Place 18 mcg into inhaler and inhale daily.   Yes Historical Provider, MD  torsemide (DEMADEX) 10 MG tablet Take 20 mg by mouth 2 (two) times daily.   Yes Historical Provider, MD  traZODone (DESYREL) 50 MG tablet 50 mg at bedtime.  07/11/15  Yes Historical Provider, MD    Inpatient Medications:  . amiodarone  150 mg Intravenous Once  . aspirin EC  325 mg Oral Daily  . atorvastatin  60 mg Oral QHS  . budesonide-formoterol  2 puff Inhalation BID  . ferrous sulfate  325 mg Oral Q breakfast  . furosemide      . furosemide  40 mg Intravenous BID  . heparin  5,000 Units Subcutaneous 3 times per day  . insulin aspart  0-9 Units Subcutaneous TID WC  . ipratropium-albuterol  3 mL Nebulization Q6H  . latanoprost  1 drop Both Eyes QHS  . lisinopril  40 mg Oral Daily  . metoprolol  200 mg Oral Daily  . morphine      . multivitamin  1 tablet Oral QHS  . multivitamin with minerals   Oral Daily  . potassium chloride SA  20 mEq Oral Daily  . senna-docusate  1 tablet Oral QHS  . sodium chloride  3 mL Intravenous Q12H  . timolol  1 drop Both Eyes Daily  . tiotropium  18 mcg Inhalation Daily  . traZODone  50 mg Oral QHS   . amiodarone     Followed by  . amiodarone      Allergies:  Allergies  Allergen Reactions  . Bee Venom Hives and Shortness Of Breath  . Felodipine Hives    Unknown reaction  . Simvastatin Hives    Social History   Social History  . Marital Status: Married    Spouse Name: N/A  . Number of Children: N/A  . Years of Education: N/A   Occupational History  . Not on file.   Social History Main Topics  . Smoking status: Former Smoker    Quit date: 03/27/2015  . Smokeless tobacco: Not on file  . Alcohol Use: No  . Drug Use: Not on file  . Sexual Activity: Not on file   Other Topics Concern  . Not on file   Social History Narrative     Family History  Problem Relation  Age of Onset  . Thyroid disease Mother      Review of Systems :Review of Systems  Unable to perform ROS  patient has BiPAP in place, unable to communicate well, denies any pain, no chest pain, shortness of breath improved on BiPAP   Labs:  Recent Labs  09/17/2015 2319 09/14/15 0527  TROPONINI 0.08* 0.09*   Lab Results  Component Value Date   WBC 5.3 09/14/2015   HGB 13.7 09/14/2015   HCT 41.6 09/14/2015   MCV 87.9 09/14/2015   PLT 106* 09/14/2015    Recent Labs Lab 09/20/2015 2319 09/14/15 0527  NA 135 139  K 3.9 3.7  CL 101 102  CO2 24 27  BUN  23* 23*  CREATININE 1.54* 1.44*  CALCIUM 9.2 9.0  PROT 8.1  --   BILITOT 1.9*  --   ALKPHOS 112  --   ALT 66*  --   AST 72*  --   GLUCOSE 133* 155*   Lab Results  Component Value Date   CHOL 198 03/15/2013   HDL 55 03/15/2013   LDLCALC 125* 03/15/2013   TRIG 88 03/15/2013   No results found for: DDIMER  Radiology/Studies:  Dg Chest 1 View  09/12/2015  CLINICAL DATA:  Shortness of breath.  COPD. EXAM: CHEST 1 VIEW COMPARISON:  Chest CT of 08/09/2015.  Plain film of 06/15/2015. FINDINGS: Midline trachea. Cardiomegaly accentuated by AP portable technique. Probable small left larger than right pleural effusions. No pneumothorax. Underlying pattern of interstitial fibrosis identified. Superimposed interstitial edema, worse on the right. Bibasilar airspace disease. The known right lower lobe lung mass is not readily apparent. IMPRESSION: Congestive heart failure, superimposed upon pulmonary fibrosis. Small bilateral pleural effusions with bibasilar airspace disease. Atelectasis or concurrent infection. Please see CT of 08/09/2015 for description of right lower lobe primary bronchogenic carcinoma. Electronically Signed   By: Abigail Miyamoto M.D.   On: 09/05/2015 23:19    EKG: EKG sinus tachycardia with ventricular rate of 99 bpm, nonspecific ST-T abnormality.   Weights: Filed Weights   09/16/2015 2224 09/14/15 0313  Weight: 210  lb (95.255 kg) 214 lb 11.2 oz (97.387 kg)     Physical Exam:tele showing sinus tachycardia with frequent short runs of atrial tachycardia versus reentrant rhythm/SVT Blood pressure 122/95, pulse 47, temperature 98.4 F (36.9 C), temperature source Oral, resp. rate 24, height '5\' 6"'$  (1.676 m), weight 214 lb 11.2 oz (97.387 kg), SpO2 98 %. Body mass index is 34.67 kg/(m^2). General: Currently wearing BiPAP, lethargic, arousable to voice, able to answer questions though minimally verbal Head: Normocephalic, atraumatic, sclera non-icteric, no xanthomas, nares are without discharge.  Neck: Negative for carotid bruits. Unable to estimate JVP Lungs: Unlabored breathing, on BiPAP, coarse breath sounds bilaterally Heart: Regular rate, tachycardic, frequent runs of tachyarrhythmia, murmur appreciated right sternal border, 3/6 Abdomen: Soft, non-tender, non-distended with normoactive bowel sounds. No hepatomegaly. No rebound/guarding. No obvious abdominal masses. Msk:  Strength and tone appear normal for age. Extremities: No clubbing or cyanosis. No edema.  Distal pedal pulses are 2+ and equal bilaterally. Neuro: Alert and oriented X 3. No facial asymmetry. No focal deficit. Moves all extremities spontaneously. Psych:  Responds to questions appropriately with a normal affect. lethargic     Assessment and Plan:   1. Acute on chronic Respiratory distress  Likely multifactorial including underlying lung cancer, fibrosis, Acute on chronic systolic congestive heart failure.  Unable to exclude bronchitis given thick deep cough, no sputum production yet -Similar presentation July 2016 at outside hospital -EF of 25-30%  moderate MR and moderate AS (likely underestimated secondary to low ejection fraction) --Also being followed by ICU team, critical care physicians Agree with current plan, -Aggressive Lasix with  Lasix 40 mg IV 3 times a day In the setting of acute CHF, would hold beta blockers Would use  low doses Aldactone, ACE inhibitor given borderline low blood pressure -Continue BiPAP ----Telemetry showing short runs of narrow complex tachycardia concerning for atrial tachycardia versus atrial fibrillation. Discussed with critical care team, high risk of arrhythmia and atrial fibrillation in his current state. We'll start amiodarone infusion. This will help with rate control, arrhythmia. Rate currently 110 up to 120  2. Acute on chronic systolic CHF  Ejection fraction 25%, echocardiogram pending -Notes do not indicate history of ischemic cardiomyopathy, suspect nonischemic. We'll try to obtain records Central line may need to be placed, CVP could be obtained at that time Again would continue Lasix as above, hold beta blockers in the setting of acute CHF Consider low-dose ace though with close monitoring of blood pressure and renal function Continue BiPAP support No plan for ischemia workup at this time  3. Lung cancer undergoing radiation treatment, under care of oncology. -Patient the last radiation in November 2016  4. COPD, stable on home medications.  Prior smoking history May benefit from nebulizers May be a component of COPD exacerbation Covered with broad-spectrum antibiotics by Dr. [name] (CAse discussed with Dr. [name])  5. Diabetes mellitus type 2, stable. -Sliding-scale insulin for now  6. Hypertension,  stable on home medications.  7. History of prostate cancer undergoing treatment with LUPRON  8. Chronic thrombocytopenia We'll continue to monitor   Signed, Esmond Plants, MD [facility]  09/14/2015, 8:34 AM

## 2015-09-14 NOTE — H&P (Signed)
[facility] [place] NAME: Gabriel Gregory    MR#:  [id number]  DATE OF BIRTH:  1935/12/08  DATE OF ADMISSION:  09/11/2015  PRIMARY CARE PHYSICIAN: Lloyd Huger, MD   REQUESTING/REFERRING PHYSICIAN: Dr. Dahlia Client  CHIEF COMPLAINT:   Chief Complaint  Patient presents with  . Shortness of Breath  . Weakness    HISTORY OF PRESENT ILLNESS:  Gabriel Gregory  is a 79 y.o. male with a known history of lung cancer undergoing radiation treatment, hypertension, COPD, diabetes mellitus type 2, history of congestive heart failure presents with the complaints of ongoing shortness of breath with increased pedal edema for the past 2 weeks which further worsened tonight.  Denies any chest pain, palpitations, fever, cough, wheezing, nausea, vomiting, diarrhea, dysuria, abdominal pain. Does complain of generalized weakness but denies any focal weakness or numbness.  Evaluation in the ED revealed mild respiratory distress with respiratory rate of 24 and room air O2 saturations in the upper 80s but stable blood pressure. Patient was placed on O2 supplementation through nasal cannula. Lab work revealed elevated BNP of more than 4500, BUN/creatinine 23/1.54, troponin 0.08, AST/ALT 77/66. CBC within normal limits except for platelets of 112 K. Chest x-ray reported as congestive heart failure. EKG sinus tachycardia with ventricular rate of 99 bpm, nonspecific ST-T abnormality. Patient was given 40 mg of IV furosemide and Foley catheterization was placed in the ED. Patient subsequently diuresed and started feeling better. Hospitalist service was consulted for further management.  At the current time patient is resting in the bed, not in acute distress, states feeling better. Denies any chest pain.    PAST MEDICAL HISTORY:   Past Medical History  Diagnosis Date  . COPD (chronic obstructive pulmonary disease) ([place])   . CHF (congestive heart failure) ([place])    . Diabetes mellitus without complication ([place])   . Hypertension   . Pulmonary fibrosis ([place])   . Renal disorder   . Aortic aneurysm ([place])   . Prostate cancer ([place])   . Lung cancer ([place])     PAST SURGICAL HISTORY:   Past Surgical History  Procedure Laterality Date  . Back surgery    . Prostate surgery      SOCIAL HISTORY:   Social History  Substance Use Topics  . Smoking status: Former Smoker    Quit date: 03/27/2015  . Smokeless tobacco: Not on file  . Alcohol Use: No    FAMILY HISTORY:   Family History  Problem Relation Age of Onset  . Thyroid disease Mother     DRUG ALLERGIES:   Allergies  Allergen Reactions  . Bee Venom Hives and Shortness Of Breath  . Felodipine Hives    Unknown reaction  . Simvastatin Hives    REVIEW OF SYSTEMS:   Review of Systems  Constitutional: Positive for malaise/fatigue. Negative for fever and chills.  HENT: Negative for ear pain, hearing loss, nosebleeds, sore throat and tinnitus.   Eyes: Negative for blurred vision, double vision, pain, discharge and redness.  Respiratory: Positive for shortness of breath. Negative for cough, hemoptysis, sputum production and wheezing.   Cardiovascular: Positive for orthopnea and leg swelling. Negative for chest pain and palpitations.  Gastrointestinal: Negative for nausea, vomiting, abdominal pain, diarrhea, constipation, blood in stool and melena.  Genitourinary: Negative for dysuria, urgency, frequency and hematuria.  Musculoskeletal: Negative for back pain, joint pain and neck pain.  Skin: Negative for itching and rash.  Neurological: Positive for weakness. Negative  for dizziness, tingling, sensory change, focal weakness and seizures.  Endo/Heme/Allergies: Does not bruise/bleed easily.  Psychiatric/Behavioral: Negative for depression. The patient is not nervous/anxious.     MEDICATIONS AT HOME:   Prior to Admission medications   Medication Sig Start Date End Date Taking? Authorizing  Provider  ammonium lactate (LAC-HYDRIN) 12 % lotion Apply 1 application topically as needed for dry skin.   Yes Historical Provider, MD  aspirin EC 325 MG tablet Take 325 mg by mouth daily.   Yes Historical Provider, MD  atorvastatin (LIPITOR) 40 MG tablet Take 60 mg by mouth at bedtime.   Yes Historical Provider, MD  B Complex Vitamins (B COMPLEX PO) Take 1 tablet by mouth daily.   Yes Historical Provider, MD  budesonide-formoterol (SYMBICORT) 160-4.5 MCG/ACT inhaler Inhale 2 puffs into the lungs 2 (two) times daily.   Yes Historical Provider, MD  ferrous sulfate 325 (65 FE) MG tablet Take 325 mg by mouth daily with breakfast.   Yes Historical Provider, MD  latanoprost (XALATAN) 0.005 % ophthalmic solution Administer 1 drop to the right eye nightly.   Yes Historical Provider, MD  lisinopril (PRINIVIL,ZESTRIL) 40 MG tablet Take 40 mg by mouth daily.   Yes Historical Provider, MD  metFORMIN (GLUCOPHAGE) 500 MG tablet Take 500 mg by mouth 2 (two) times daily with a meal.   Yes Historical Provider, MD  metoprolol (TOPROL-XL) 200 MG 24 hr tablet Take 200 mg by mouth daily.   Yes Historical Provider, MD  Multiple Vitamin (MULTI-VITAMINS) TABS Take by mouth.   Yes Historical Provider, MD  potassium chloride SA (K-DUR,KLOR-CON) 20 MEQ tablet Take 20 mEq by mouth daily.   Yes Historical Provider, MD  sennosides-docusate sodium (SENOKOT-S) 8.6-50 MG tablet Take 1 tablet by mouth at bedtime.   Yes Historical Provider, MD  timolol (BETIMOL) 0.25 % ophthalmic solution    Yes Historical Provider, MD  tiotropium (SPIRIVA) 18 MCG inhalation capsule Place 18 mcg into inhaler and inhale daily.   Yes Historical Provider, MD  torsemide (DEMADEX) 10 MG tablet Take 20 mg by mouth 2 (two) times daily.   Yes Historical Provider, MD  traZODone (DESYREL) 50 MG tablet 50 mg at bedtime.  07/11/15  Yes Historical Provider, MD      VITAL SIGNS:  Blood pressure 126/88, pulse 37, temperature 97.5 F (36.4 C), temperature  source Oral, resp. rate 25, height '5\' 6"'$  (1.676 m), weight 95.255 kg (210 lb), SpO2 95 %.  PHYSICAL EXAMINATION:  Physical Exam  Constitutional: He is oriented to person, place, and time. He appears distressed (mild distress +).  Chronically looking +  HENT:  Head: Normocephalic and atraumatic.  Right Ear: External ear normal.  Left Ear: External ear normal.  Nose: Nose normal.  Mouth/Throat: Oropharynx is clear and moist. No oropharyngeal exudate.  Eyes: EOM are normal. Pupils are equal, round, and reactive to light. No scleral icterus.  Neck: Normal range of motion. Neck supple. No JVD present. No thyromegaly present.  Cardiovascular: Normal rate, regular rhythm, normal heart sounds and intact distal pulses.  Exam reveals no friction rub.   No murmur heard. Respiratory: Effort normal. No respiratory distress. He has no wheezes. He has rales. He exhibits no tenderness.  GI: Soft. Bowel sounds are normal. He exhibits no distension and no mass. There is no tenderness. There is no rebound and no guarding.  Musculoskeletal: Normal range of motion. He exhibits edema.  Lymphadenopathy:    He has no cervical adenopathy.  Neurological: He is alert and  oriented to person, place, and time. He has normal reflexes. He displays normal reflexes. No cranial nerve deficit. He exhibits normal muscle tone.  Skin: Skin is warm. No rash noted. No erythema.  Psychiatric: He has a normal mood and affect. His behavior is normal. Thought content normal.   LABORATORY PANEL:   CBC  Recent Labs Lab 09/05/2015 2319  WBC 5.4  HGB 13.7  HCT 43.4  PLT 112*   ------------------------------------------------------------------------------------------------------------------  Chemistries   Recent Labs Lab 09/12/2015 2319  NA 135  K 3.9  CL 101  CO2 24  GLUCOSE 133*  BUN 23*  CREATININE 1.54*  CALCIUM 9.2  AST 72*  ALT 66*  ALKPHOS 112  BILITOT 1.9*    ------------------------------------------------------------------------------------------------------------------  Cardiac Enzymes  Recent Labs Lab 09/10/2015 2319  TROPONINI 0.08*   ------------------------------------------------------------------------------------------------------------------  RADIOLOGY:  Dg Chest 1 View  09/25/2015  CLINICAL DATA:  Shortness of breath.  COPD. EXAM: CHEST 1 VIEW COMPARISON:  Chest CT of 08/09/2015.  Plain film of 06/15/2015. FINDINGS: Midline trachea. Cardiomegaly accentuated by AP portable technique. Probable small left larger than right pleural effusions. No pneumothorax. Underlying pattern of interstitial fibrosis identified. Superimposed interstitial edema, worse on the right. Bibasilar airspace disease. The known right lower lobe lung mass is not readily apparent. IMPRESSION: Congestive heart failure, superimposed upon pulmonary fibrosis. Small bilateral pleural effusions with bibasilar airspace disease. Atelectasis or concurrent infection. Please see CT of 08/09/2015 for description of right lower lobe primary bronchogenic carcinoma. Electronically Signed   By: Abigail Miyamoto M.D.   On: 09/28/2015 23:19    EKG:   Orders placed or performed during the hospital encounter of 09/26/2015  . EKG 12-Lead  . EKG 12-Lead  . ED EKG  . ED EKG  Sinus tachycardia with ventricular rate of 99 bpm, nonspecific ST-T abnormality  IMPRESSION AND PLAN:   79 year old male with history of lung cancer undergoing radiation treatment, hypertension, COPD, diabetes mellitus type 2, history of congestive heart failure presents with the complaints of ongoing shortness of breath with increasing pedal edema for the past 2 weeks with further worsening last night, found to have acute on chronic congestive heart failure and mildly elevated troponin.  1. Shortness of breath secondary to acute on chronic congestive heart failure. History of CHF in the past etiology not known.  History of noncompliance to medical management + 2. Elevated troponin, mild. No chest pain, no history of coronary artery disease. EKG no acute ischemic changes. Likely demand ischemia, rule out ACS. 3. Lung cancer undergoing radiation treatment, under care of oncology. 4. COPD, stable on home medications. No acute exacerbation. 5. Diabetes mellitus type 2, stable. 6. Hypertension, stable on home medications. 7. Borderline low platelets, patient stable.  Plan: Admit to telemetry, continue aspirin, beta blocker, IV furosemide, statin. Cycle cardiac enzymes. Monitor urine output and BMP. 2-D echocardiogram and cardiac consultation requested for further evaluation and advice. - Oncology consultation requested for follow-up of lung cancer. - Hold home medications for diabetes, sliding scale insulin, follow up blood sugars closely. -Continue home medication for COPD, when necessary DuoNeb's, monitor O2 sats. -Continue home medications for hypertension, follow-up BP monitoring. - Monitor platelet counts.     All the records are reviewed and case discussed with ED provider. Management plans discussed with the patient, family and they are in agreement.  CODE STATUS: Full code DVT prophylaxis: Subcutaneous heparin  TOTAL TIME TAKING CARE OF THIS PATIENT: 50 minutes.    [name] M.D on 09/14/2015 at 3:06  AM  Between 7am to 6pm - Pager - [phone]  After 6pm go to [email or web address] - password [email or web address]  Office  [phone]  CC: Primary care physician; Lloyd Huger, MD

## 2015-09-14 NOTE — Progress Notes (Signed)
ANTIBIOTIC CONSULT NOTE - INITIAL  Pharmacy Consult for Vancomycin/Zosyn Indication: pneumonia  Allergies  Allergen Reactions  . Bee Venom Hives and Shortness Of Breath  . Felodipine Hives    Unknown reaction  . Simvastatin Hives    Patient Measurements: Height: '5\' 6"'$  (167.6 cm) Weight: 214 lb 11.2 oz (97.387 kg) IBW/kg (Calculated) : 63.8 Adjusted Body Weight: 77.2 kg  Vital Signs: Temp: 98.4 F (36.9 C) (12/14 0313) Temp Source: Oral (12/14 0313) BP: 122/95 mmHg (12/14 0700) Pulse Rate: 47 (12/14 0805) Intake/Output from previous day: 12/13 0701 - 12/14 0700 In: 10  Out: 200 [Urine:200] Intake/Output from this shift:    Labs:  Recent Labs  09/06/2015 1134 09/20/2015 2319 09/14/15 0527  WBC 5.8 5.4 5.3  HGB 13.9 13.7 13.7  PLT 125* 112* 106*  CREATININE 1.40* 1.54* 1.44*   Estimated Creatinine Clearance: 46.9 mL/min (by C-G formula based on Cr of 1.44). No results for input(s): VANCOTROUGH, VANCOPEAK, VANCORANDOM, GENTTROUGH, GENTPEAK, GENTRANDOM, TOBRATROUGH, TOBRAPEAK, TOBRARND, AMIKACINPEAK, AMIKACINTROU, AMIKACIN in the last 72 hours.   Microbiology: No results found for this or any previous visit (from the past 720 hour(s)).  Medical History: Past Medical History  Diagnosis Date  . COPD (chronic obstructive pulmonary disease) ([place])   . CHF (congestive heart failure) ([place])   . Diabetes mellitus without complication ([place])   . Hypertension   . Pulmonary fibrosis ([place])   . Renal disorder   . Aortic aneurysm ([place])   . Prostate cancer ([place])   . Lung cancer ([place])     Medications:  Scheduled:  . amiodarone  150 mg Intravenous Once  . aspirin EC  325 mg Oral Daily  . atorvastatin  60 mg Oral QHS  . budesonide-formoterol  2 puff Inhalation BID  . ferrous sulfate  325 mg Oral Q breakfast  . furosemide      . furosemide  40 mg Intravenous BID  . heparin  5,000 Units Subcutaneous 3 times per day  . insulin aspart  0-9 Units Subcutaneous TID WC  .  ipratropium-albuterol  3 mL Nebulization Q6H  . latanoprost  1 drop Both Eyes QHS  . lisinopril  40 mg Oral Daily  . metoprolol  200 mg Oral Daily  . morphine      . multivitamin  1 tablet Oral QHS  . multivitamin with minerals   Oral Daily  . piperacillin-tazobactam (ZOSYN)  IV  3.375 g Intravenous 3 times per day  . potassium chloride SA  20 mEq Oral Daily  . senna-docusate  1 tablet Oral QHS  . sodium chloride  3 mL Intravenous Q12H  . timolol  1 drop Both Eyes Daily  . tiotropium  18 mcg Inhalation Daily  . traZODone  50 mg Oral QHS  . vancomycin  1,000 mg Intravenous STAT  . vancomycin  1,000 mg Intravenous Q18H   Infusions:  . amiodarone     Followed by  . amiodarone     Assessment: 79 y/o M with lung cancer and CHF/COPD exacerbation ordered empiric abx for possible PNA.   Ke: 0.043 h-1 Vd: 54 L  Goal of Therapy:  Vancomycin trough level 15-20 mcg/ml  Plan:  Vancomycin 1000 mg iv q 18 hours with stacked dosing and a trough with the 5th total dose.   Zosyn 3.375 g EI q 8 hours.   Pharmacy will continue to follow renal function and culture results.   Ulice Dash D 09/14/2015,8:54 AM

## 2015-09-14 NOTE — Consult Note (Signed)
After discussion with Family, patient will now be DNR/DNI patient would NOT want vent and does NOT want chest compressions

## 2015-09-14 NOTE — Progress Notes (Signed)
[facility] [place] NAME: Gabriel Gregory    MR#:  [id number]  DATE OF BIRTH:  [birth year]  SUBJECTIVE:  Came in with increasing shortness of breath for last several days, bilateral leg edema, increased weakness and fatigability. Denies chest pain Patient has labored breathing. His sats are 97% on 3 L. Appears very fatigued and tired. Chest x-ray shows bilateral severe pulmonary edema.  REVIEW OF SYSTEMS:   Review of Systems  Constitutional: Positive for malaise/fatigue. Negative for fever, chills and weight loss.  HENT: Negative for ear discharge, ear pain and nosebleeds.   Eyes: Negative for blurred vision, pain and discharge.  Respiratory: Positive for cough and shortness of breath. Negative for sputum production, wheezing and stridor.   Cardiovascular: Positive for orthopnea and leg swelling. Negative for chest pain, palpitations and PND.  Gastrointestinal: Negative for nausea, vomiting, abdominal pain and diarrhea.  Genitourinary: Negative for urgency and frequency.  Musculoskeletal: Negative for back pain and joint pain.  Neurological: Positive for weakness. Negative for sensory change, speech change and focal weakness.  Psychiatric/Behavioral: Negative for depression and hallucinations. The patient is not nervous/anxious.   All other systems reviewed and are negative.  Tolerating Diet:yes Tolerating PT: pending  DRUG ALLERGIES:   Allergies  Allergen Reactions  . Bee Venom Hives and Shortness Of Breath  . Felodipine Hives    Unknown reaction  . Simvastatin Hives    VITALS:  Blood pressure 122/95, pulse 111, temperature 98.4 F (36.9 C), temperature source Oral, resp. rate 20, height '5\' 6"'$  (1.676 m), weight 214 lb 11.2 oz (97.387 kg), SpO2 98 %.  PHYSICAL EXAMINATION:   Physical Exam  GENERAL:  79 y.o.-year-old patient lying in the bed with no acute distress. Appears critically ill EYES: Pupils equal, round,  reactive to light and accommodation. No scleral icterus. Extraocular muscles intact. Pallor plus HEENT: Head atraumatic, normocephalic. Oropharynx and nasopharynx clear.  NECK:  Supple, no jugular venous distention. No thyroid enlargement, no tenderness.  LUNGS: Decreased breath sounds bilaterally, no wheezing, rales, rhonchi. Positive use of accessory muscles of respiration. Tachypnea CARDIOVASCULAR: S1, S2 normal. No murmurs, rubs, or gallops.  ABDOMEN: Soft, nontender, nondistended. Bowel sounds present. No organomegaly or mass.  EXTREMITIES: No cyanosis, clubbing, ++++edema b/l.    NEUROLOGIC: Cranial nerves II through XII are intact. No focal Motor or sensory deficits b/l.   PSYCHIATRIC: The patient is alert and oriented x 3.  SKIN: No obvious rash, lesion, or ulcer.   LABORATORY PANEL:   CBC  Recent Labs Lab 09/14/15 0527  WBC 5.3  HGB 13.7  HCT 41.6  PLT 106*    Chemistries   Recent Labs Lab 10/01/2015 2319 09/14/15 0527  NA 135 139  K 3.9 3.7  CL 101 102  CO2 24 27  GLUCOSE 133* 155*  BUN 23* 23*  CREATININE 1.54* 1.44*  CALCIUM 9.2 9.0  AST 72*  --   ALT 66*  --   ALKPHOS 112  --   BILITOT 1.9*  --     Cardiac Enzymes  Recent Labs Lab 09/14/15 0527  TROPONINI 0.09*    RADIOLOGY:  Dg Chest 1 View  10/01/2015  CLINICAL DATA:  Shortness of breath.  COPD. EXAM: CHEST 1 VIEW COMPARISON:  Chest CT of 08/09/2015.  Plain film of 06/15/2015. FINDINGS: Midline trachea. Cardiomegaly accentuated by AP portable technique. Probable small left larger than right pleural effusions. No pneumothorax. Underlying pattern of interstitial fibrosis identified. Superimposed interstitial edema, worse on  the right. Bibasilar airspace disease. The known right lower lobe lung mass is not readily apparent. IMPRESSION: Congestive heart failure, superimposed upon pulmonary fibrosis. Small bilateral pleural effusions with bibasilar airspace disease. Atelectasis or concurrent infection.  Please see CT of 08/09/2015 for description of right lower lobe primary bronchogenic carcinoma. Electronically Signed   By: Abigail Miyamoto M.D.   On: 09/21/2015 23:19   ASSESSMENT AND PLAN:   79 year old male with history of lung cancer undergoing radiation treatment, hypertension, COPD, diabetes mellitus type 2, history of congestive heart failure presents with the complaints of ongoing shortness of breath with increasing pedal edema for the past 2 weeks with further worsening last night, found to have acute on chronic congestive heart failure and mildly elevated troponin.  1. Shortness of breath secondary to acute on chronic congestive heart failure. Systolic - Echo in September 2016 at [facility] showed EF of 25-30% with restrictive filling pattern, moderate MS and moderate ES. -We'll transfer to CCU stat -Patient already received IV Lasix 60 mg and 0.5 mg morphine -Continue Lasix 40 mg IV 3 times a day -Add Coreg, Spironolactone, ACE inhibitor's -Spoke with Dr. Rockey Situ to see patient. Patient likely is a candidate for CHF vest -BiPAP  2. Elevated troponin, mild. No chest pain, no history of coronary artery disease. EKG no acute ischemic changes. Likely demand ischemia, rule out ACS. -Continue aspirin  3. Lung cancer undergoing radiation treatment, under care of oncology. -Patient the last radiation in November 2016  4. COPD, stable on home medications. No acute exacerbation. -Nebs as needed  5. Diabetes mellitus type 2, stable. -Sliding-scale insulin for now  6. Hypertension, stable on home medications.   7. History of prostate cancer undergoing treatment with LUPRON  8. Chronic thrombocytopenia Patient's platelet count is anywhere from 106-125. We'll continue to monitor shows downward trend hold off on antiplatelet agents.  Case discussed with Care Management/Social Worker. Management plans discussed with the patient, family and they are in agreement.  CODE STATUS: Full  DVT  Prophylaxis:  Lovenox  TOTAL  CRITICALTIME TAKING CARE OF THIS PATIENT: 8mnutes.  >50% time spent on counselling and coordination of care patient, RN, cardiology    Dajanay Northrup M.D on 09/14/2015 at 7:28 AM  Between 77am to 6pm - Pager - [phone]  After 6pm go to [email or web address] - password [email or web address]  Office  [phone] CC: Primary care physician; TLloyd Huger MD

## 2015-09-14 NOTE — Progress Notes (Signed)
Offered compassionate presence and support.  Family comfortable nothing needed at this time. [place] 1200

## 2015-09-14 NOTE — Procedures (Signed)
Central Venous Catheter Placement: Indication: Patient receiving vesicant or irritant drug.; Patient receiving intravenous therapy for longer than 5 days.; Patient has limited or no vascular access.   Consent:verbal  Risks and benefits explained in detail including risk of infection, bleeding, respiratory failure and death..   Hand washing performed prior to starting the procedure.   Procedure: An active timeout was performed and correct patient, name, & ID confirmed.  After explaining risk and benefits, patient was positioned correctly for central venous access. Patient was prepped using strict sterile technique including chlorohexadine preps, sterile drape, sterile gown and sterile gloves.  The area was prepped, draped and anesthetized in the usual sterile manner. Patient comfort was obtained.  A triple lumen catheter was placed in Left IJ Vein There was good blood return, catheter caps were placed on lumens, catheter flushed easily, the line was secured and a sterile dressing and BIO-PATCH applied.   Ultrasound was used to visualize vasculature and guidance of needle.   Number of Attempts: 1 Complications:none Estimated Blood Loss: none Chest Radiograph indicated and ordered.  Operator: Padraig Nhan.   Corrin Parker, M.D.  [facility]  Medical Director [place] Director [facility] Cardio-Pulmonary Department

## 2015-09-14 NOTE — Progress Notes (Signed)
Per Dr Bridget Hartshorn family requested to keep monitor on. Per Dr Bridget Hartshorn keep patient in recliner even though he is lethargic and will be on morphin drip. Patient and family both declined getting back in bed.

## 2015-09-14 NOTE — Progress Notes (Signed)
Pt transferred to CCU room 13 to be placed on bipap.  Pt reports he called his wife and informed her of the move, Dr. Posey Pronto notified the patients daughter, Santiago Glad.  Report called to [facility]- pt transported with respiratory therapy on 4 L.   Jessee Avers

## 2015-09-14 NOTE — Progress Notes (Signed)
   09/14/15 1300  Clinical Encounter Type  Visited With Patient and family together  Visit Type Initial;Spiritual support  Consult/Referral To Chaplain  Spiritual Encounters  Spiritual Needs Emotional  Stress Factors  Patient Stress Factors None identified  Family Stress Factors None identified  Chaplain rounded in the unit and offered a compassionate presence and support as applicable. No need was identified at this time. Chaplain Aysia Lowder A. Nithya Meriweather Ext. [phone]

## 2015-09-14 NOTE — Progress Notes (Signed)
This note also relates to the following rows which could not be included: Pulse Rate - Cannot attach notes to unvalidated device data Resp - Cannot attach notes to unvalidated device data SpO2 - Cannot attach notes to unvalidated device data     09/14/15 1227  BiPAP/CPAP/SIPAP  BiPAP/CPAP/SIPAP Pt Type (on hold)  removed patient from bipap per his request.  Placed patient on 3lpm nasal canula.  Patient tolerating well.

## 2015-09-15 MED ORDER — LORAZEPAM 2 MG/ML IJ SOLN
1.0000 mg | INTRAMUSCULAR | Status: DC | PRN
  Administered 2015-09-15: 15:00:00 1 mg via INTRAVENOUS
  Filled 2015-09-15: qty 1

## 2015-09-15 MED ORDER — MORPHINE 100MG IN NS 100ML (1MG/ML) PREMIX INFUSION: 4.0000 mg/h | INTRAVENOUS | Status: DC

## 2015-09-15 MED ORDER — LORAZEPAM 2 MG/ML IJ SOLN
0.5000 mg | Freq: Once | INTRAMUSCULAR | Status: AC
  Administered 2015-09-15: 13:00:00 0.5 mg via INTRAVENOUS

## 2015-09-15 MED FILL — Timolol Maleate Ophth Soln 0.25%: OPHTHALMIC | Qty: 5 | Status: AC

## 2015-10-01 NOTE — Progress Notes (Signed)
[place]  [phone] [phone]  ID: Claudie Fisherman OB: 1935/11/19  MR#: [id number]  YQM#:[id number]  Patient Care Team: Lloyd Huger, MD as PCP - General (Oncology)  CHIEF COMPLAINT:  Chief Complaint  Patient presents with  . Prostate Cancer  . Lung Cancer    INTERVAL HISTORY: Patient returns to clinic today for further evaluation and continuation of Lupron. He continues to have increased weakness and fatigue. Patient has noted worsening shortness of breath and dyspnea on exertion as well. He has no chest pain. He also has worsening bilateral lower extremity edema. He has no neurologic complaints. He denies any weight loss. He denies any bony pain.  He denies any nausea, vomiting, constipation, or diarrhea. He has no urinary complaints. Patient feels generally terrible today, but offers no further specific complaints.  REVIEW OF SYSTEMS:   Review of Systems  Constitutional: Positive for malaise/fatigue. Negative for fever.  Respiratory: Positive for shortness of breath. Negative for cough.   Cardiovascular: Positive for leg swelling. Negative for chest pain.  Gastrointestinal: Negative.   Musculoskeletal: Negative.   Neurological: Negative.     As per HPI. Otherwise, a complete review of systems is negatve.  PAST MEDICAL HISTORY: Past Medical History  Diagnosis Date  . COPD (chronic obstructive pulmonary disease) ([place])   . CHF (congestive heart failure) ([place])   . Diabetes mellitus without complication ([place])   . Hypertension   . Pulmonary fibrosis ([place])   . Renal disorder   . Aortic aneurysm ([place])   . Prostate cancer ([place])   . Lung cancer ([place])     PAST SURGICAL HISTORY: Past Surgical History  Procedure Laterality Date  . Back surgery    . Prostate surgery      FAMILY HISTORY: reviewed and unchanged. No reported history of malignancy or chronic disease.     ADVANCED DIRECTIVES:    HEALTH MAINTENANCE: Social  History  Substance Use Topics  . Smoking status: Former Smoker    Quit date: 03/27/2015  . Smokeless tobacco: Not on file  . Alcohol Use: No     Colonoscopy:  PAP:  Bone density:  Lipid panel:  Allergies  Allergen Reactions  . Bee Venom Hives and Shortness Of Breath  . Felodipine Hives    Unknown reaction  . Simvastatin Hives    Current Outpatient Prescriptions  Medication Sig Dispense Refill  . ammonium lactate (LAC-HYDRIN) 12 % lotion Apply 1 application topically as needed for dry skin.    Marland Kitchen aspirin EC 325 MG tablet Take 325 mg by mouth daily.    Marland Kitchen atorvastatin (LIPITOR) 40 MG tablet Take 60 mg by mouth at bedtime.    . B Complex Vitamins (B COMPLEX PO) Take 1 tablet by mouth daily.    . ferrous sulfate 325 (65 FE) MG tablet Take 325 mg by mouth daily with breakfast.    . latanoprost (XALATAN) 0.005 % ophthalmic solution Administer 1 drop to the right eye nightly.    Marland Kitchen lisinopril (PRINIVIL,ZESTRIL) 40 MG tablet Take 40 mg by mouth daily.    . Multiple Vitamin (MULTI-VITAMINS) TABS Take by mouth.    . potassium chloride SA (K-DUR,KLOR-CON) 20 MEQ tablet Take 20 mEq by mouth daily.    . timolol (BETIMOL) 0.25 % ophthalmic solution     . traZODone (DESYREL) 50 MG tablet 50 mg at bedtime.     . budesonide-formoterol (SYMBICORT) 160-4.5 MCG/ACT inhaler Inhale 2 puffs into the lungs 2 (two) times daily.    Marland Kitchen  metFORMIN (GLUCOPHAGE) 500 MG tablet Take 500 mg by mouth 2 (two) times daily with a meal.    . metoprolol (TOPROL-XL) 200 MG 24 hr tablet Take 200 mg by mouth daily.    . sennosides-docusate sodium (SENOKOT-S) 8.6-50 MG tablet Take 1 tablet by mouth at bedtime.    Marland Kitchen tiotropium (SPIRIVA) 18 MCG inhalation capsule Place 18 mcg into inhaler and inhale daily.    Marland Kitchen torsemide (DEMADEX) 10 MG tablet Take 20 mg by mouth 2 (two) times daily.     No current facility-administered medications for this visit.    OBJECTIVE: Filed Vitals:   09/07/2015 1037  BP: 124/85  Pulse: 105    Temp: 98.3 F (36.8 C)  Resp: 16     There is no weight on file to calculate BMI.    ECOG FS:0 - Asymptomatic  General: Ill-appearing, mild respiratory distress. Eyes: Pink conjunctiva, anicteric sclera. Lungs: Scattered wheezing and rhonchi throughout. Heart: Regular rate and rhythm. No rubs, murmurs, or gallops. Abdomen: Soft, nontender, nondistended. No organomegaly noted, normoactive bowel sounds. Musculoskeletal: 2+ bilateral edema. Neuro: Alert, answering all questions appropriately. Cranial nerves grossly intact. Skin: No rashes or petechiae noted. Psych: Normal affect.   LAB RESULTS:  Lab Results  Component Value Date   NA 139 09/14/2015   K 3.7 09/14/2015   CL 102 09/14/2015   CO2 27 09/14/2015   GLUCOSE 155* 09/14/2015   BUN 23* 09/14/2015   CREATININE 1.44* 09/14/2015   CALCIUM 9.0 09/14/2015   PROT 8.1 09/21/2015   ALBUMIN 3.3* 09/11/2015   AST 72* 09/27/2015   ALT 66* 09/24/2015   ALKPHOS 112 09/21/2015   BILITOT 1.9* 09/23/2015   GFRNONAA 45* 09/14/2015   GFRAA 53* 09/14/2015    Lab Results  Component Value Date   WBC 5.3 09/14/2015   NEUTROABS 5.1 09/05/2015   HGB 13.7 09/14/2015   HCT 41.6 09/14/2015   MCV 87.9 09/14/2015   PLT 106* 09/14/2015     STUDIES: Dg Chest 1 View  09/24/2015  CLINICAL DATA:  Shortness of breath.  COPD. EXAM: CHEST 1 VIEW COMPARISON:  Chest CT of 08/09/2015.  Plain film of 06/15/2015. FINDINGS: Midline trachea. Cardiomegaly accentuated by AP portable technique. Probable small left larger than right pleural effusions. No pneumothorax. Underlying pattern of interstitial fibrosis identified. Superimposed interstitial edema, worse on the right. Bibasilar airspace disease. The known right lower lobe lung mass is not readily apparent. IMPRESSION: Congestive heart failure, superimposed upon pulmonary fibrosis. Small bilateral pleural effusions with bibasilar airspace disease. Atelectasis or concurrent infection. Please see CT of  08/09/2015 for description of right lower lobe primary bronchogenic carcinoma. Electronically Signed   By: Abigail Miyamoto M.D.   On: 09/22/2015 23:19   Dg Chest Port 1 View  09/14/2015  CLINICAL DATA:  Central line placement. EXAM: PORTABLE CHEST 1 VIEW COMPARISON:  Same day. FINDINGS: Stable cardiomegaly. Stable bilateral pulmonary edema is noted with probable mild bilateral pleural effusions. No pneumothorax is noted. Interval placement of left internal jugular catheter line with distal tip in expected position of the cavoatrial junction. Bony thorax is unremarkable. IMPRESSION: Cardiomegaly and severe bilateral pulmonary edema with mild associated pleural effusions is again noted consistent with congestive heart failure. Interval placement of left internal jugular catheter line with distal tip in expected position of cavoatrial junction. No pneumothorax is noted. Electronically Signed   By: Marijo Conception, M.D.   On: 09/14/2015 12:01    ASSESSMENT: Prostate cancer unknown Gleason score status post prostatectomy and  prostatic bed XRT, now with increasing PSA.  PLAN:    1. Prostate cancer: Patient's PSA on May 18, 2014 was 0.3 and has now increased to 11.58. Previously, nuclear med bone scan was negative for disease. Patient last had Lupron in September 2016. Return to clinic as previously scheduled in approximately 3 months for his next injection of Lupron and further evaluation. 2.  Osteoporosis: Bone mineral density in November 2012 revealed a T score of -2.8. Continue Fosamax, calcium, and vitamin D. 3. Lung cancer: Patient has been instructed to complete his XRT is planned. 4. Increasing shortness of breath/edema: Possibly cardiac related. We will get laboratory work today. Patient has requested a new cardiologist at his local and a referral has been made. 5. Weakness and fatigue: Likely multifactorial. Patient's iron stores and thyroid panel are within normal limits.  Approximately 30  minutes was spent in discussion of which greater than 50% was consultation.   Patient expressed understanding and was in agreement with this plan. He also understands that He can call clinic at any time with any questions, concerns, or complaints.    Lloyd Huger, MD   10/01/2015 8:30 AM

## 2015-10-02 NOTE — Discharge Summary (Signed)
DOA 09/11/2015 DOD 09/20/2015 at 4:03 pm  Final diagnosis  1. acute hypoxic respiratory failure due severe pulmonary edema  2.acute on chronic CHF,systolic 3.Lung cancer 4.Prostate cancer  80 year old male with history of lung cancer undergoing radiation treatment, hypertension, COPD, diabetes mellitus type 2, history of congestive heart failure presents with the complaints of ongoing shortness of breath with increasing pedal edema for the past 2 weeks with further worsening last night, found to have acute on chronic congestive heart failure and mildly elevated troponin.  1. Shortness of breath secondary to acute on chronic congestive heart failure. Systolicand severe pulmonary edema - Echo in September 2016 at [facility] showed EF of 25-30% with restrictive filling pattern, moderate MS and moderate ES. Pt was continuing to decline. He was seen by Palliative care and transitioned to IV morphine gtt Family understood poor prognosis  2. Elevated troponin, mild. No chest pain, no history of coronary artery disease. EKG no acute ischemic changes. Likely demand ischemia, rule out ACS. -Continue aspirin  3. Lung cancer pt was undergoing radiation treatment -Patient the last radiation in November 2016  Overall pt continued to decline. E was made comfort care after discussion was made with family. He died on Dec 20, 2024at 4:03 pm

## 2015-10-02 NOTE — Progress Notes (Signed)
Nutrition Brief Note  Pt triggered for assessment due to diagnosis. Chart reviewed. Pt now transitioning to comfort care.  No further nutrition interventions warranted at this time.  Please re-consult as needed.   Kerman Passey [place], [place], LDN [phone] Pager

## 2015-10-02 NOTE — Clinical Documentation Improvement (Signed)
Internal Medicine Palliative Care Critical Care  Can the diagnosis of CKD be further specified in progress notes and discharge summary?   CKD Stage I - GFR greater than or equal to 90  CKD Stage II - GFR 60-89  CKD Stage III - GFR 30-59  CKD Stage IV - GFR 15-29  CKD Stage V - GFR < 15  ESRD (End Stage Renal Disease)  Other condition  Unable to clinically determine   Supporting Information:  80 year old black male History: Renal disorder 12/14 progress note :  CKD Component     Latest Ref Rng 09/21/2015 09/24/2015 09/14/2015        11:34 AM 11:19 PM   BUN     6 - 20 mg/dL 21 (H) 23 (H) 23 (H)  Creatinine     0.61 - 1.24 mg/dL 1.40 (H) 1.54 (H) 1.44 (H)                                EGFR (African American)     >60 mL/min 54 (L) 48 (L) 53 (L)   Treatment Monitoring renal function   Please exercise your independent, professional judgment when responding. A specific answer is not anticipated or expected.   Thank You, [place] [phone]

## 2015-10-02 NOTE — Progress Notes (Signed)
Palliative Medicine Inpatient Consult Follow Up Note   Name: Gabriel Gregory Date: 09-21-15 MRN: [id number]  DOB: 28-Sep-1936  Referring Physician: [name], MD  Palliative Care consult requested for this 80 y.o. male for goals of medical therapy in patient with acute on chronic respiratory failure due to multifactorial causes.   TODAY'S DISCUSSIONS AND DECISIONS: 1.  Pt is DNR  2.  He is deteriorating and will not survive long.  Family desires comfort approach and they are now aware that he may pass away soon --today or within several days.  Morphine sometimes helps patient's 'rally' but this does not last long.  3.  I presented the option of Hospice in the Home and also Hospice Home. Family does not want him moved and they want him to die here since we think it won't be long.  I am OK with that.    4.  I am increasing the morphine drip to '6mg'$ /hr since '4mg'$ /hr didn't seem to help enough.   We can titrate to comfort.  5.  Pts third daughter, Gerhard Perches, will be here later tonight (she is a Merchandiser, retail herself).  Daughter, Santiago Glad, is a Marine scientist locally (a behavioral health facility --but she used to work here).  She is most 'in charge'.  Daughter, Maudie Mercury, is here.  Granddagaughter, Denton Ar, is here also.  Pts wife is in agreement with comfort terminal care.    6.  Bereavement tray is appropriate.  No more labs or xrays etc.    IMPRESSION Acute on Chronic Resp Failure Lung cancer CKD stage 3 COPD Pleural Effusion Acute on Chronic Systolic CHF Cardiomyopathy with EF 20-25% Aortic Stenosis --most probably severe (hard to determine on echo) Weakness Prostate Cancer   REVIEW OF SYSTEMS:  Patient is not able to provide ROS due to being unresponsive  CODE STATUS: DNR   PAST MEDICAL HISTORY: Past Medical History  Diagnosis Date  . COPD (chronic obstructive pulmonary disease) ([place])   . CHF (congestive heart failure) ([place])   . Diabetes mellitus without complication ([place])   .  Hypertension   . Pulmonary fibrosis ([place])   . Renal disorder   . Aortic aneurysm ([place])   . Prostate cancer ([place])   . Lung cancer ([place])     PAST SURGICAL HISTORY:  Past Surgical History  Procedure Laterality Date  . Back surgery    . Prostate surgery      Vital Signs: BP 71/41 mmHg  Pulse 34  Temp(Src) 98.5 F (36.9 C) (Oral)  Resp 30  Ht '5\' 6"'$  (1.676 m)  Wt 97.387 kg (214 lb 11.2 oz)  BMI 34.67 kg/m2  SpO2 89% Filed Weights   09/20/2015 2224 09/14/15 0313  Weight: 95.255 kg (210 lb) 97.387 kg (214 lb 11.2 oz)    Estimated body mass index is 34.67 kg/(m^2) as calculated from the following:   Height as of this encounter: '5\' 6"'$  (1.676 m).   Weight as of this encounter: 97.387 kg (214 lb 11.2 oz).  PHYSICAL EXAM: Now lethargic Using accessory muscles to breath --and occasionally moaning Eyes closed Neck no JVD or TM Hrt rrr  Abd soft and NT Ext no cyanosis noted as yet  LABS: CBC:    Component Value Date/Time   WBC 5.3 09/14/2015 0527   WBC 8.2 07/09/2013 1012   HGB 13.7 09/14/2015 0527   HGB 13.2 07/09/2013 1012   HCT 41.6 09/14/2015 0527   HCT 40.3 07/09/2013 1012   PLT 106* 09/14/2015 0527   PLT 172  07/09/2013 1012   MCV 87.9 09/14/2015 0527   MCV 85 07/09/2013 1012   NEUTROABS 5.1 09/29/2015 1134   NEUTROABS 5.9 07/09/2013 1012   LYMPHSABS 0.3* 09/12/2015 1134   LYMPHSABS 1.4 07/09/2013 1012   MONOABS 0.4 09/14/2015 1134   MONOABS 0.5 07/09/2013 1012   EOSABS 0.0 10/01/2015 1134   EOSABS 0.3 07/09/2013 1012   BASOSABS 0.0 09/05/2015 1134   BASOSABS 0.1 07/09/2013 1012   Comprehensive Metabolic Panel:    Component Value Date/Time   NA 139 09/14/2015 0527   NA 142 07/09/2013 1012   K 3.7 09/14/2015 0527   K 3.5 07/09/2013 1012   CL 102 09/14/2015 0527   CL 103 07/09/2013 1012   CO2 27 09/14/2015 0527   CO2 27 07/09/2013 1012   BUN 23* 09/14/2015 0527   BUN 15 07/09/2013 1012   CREATININE 1.44* 09/14/2015 0527   CREATININE 1.40* 07/09/2013  1012   GLUCOSE 155* 09/14/2015 0527   GLUCOSE 124* 07/09/2013 1012   CALCIUM 9.0 09/14/2015 0527   CALCIUM 8.6 07/09/2013 1012   AST 72* 09/09/2015 2319   AST 20 05/30/2013 0931   ALT 66* 09/24/2015 2319   ALT 19 05/30/2013 0931   ALKPHOS 112 09/23/2015 2319   ALKPHOS 98 05/30/2013 0931   BILITOT 1.9* 09/28/2015 2319   BILITOT 0.5 05/30/2013 0931   PROT 8.1 09/27/2015 2319   PROT 8.3* 05/30/2013 0931   ALBUMIN 3.3* 09/29/2015 2319   ALBUMIN 3.2* 05/30/2013 0931     More than 50% of the visit was spent in counseling/coordination of care: YES  Time Spent:  35 min

## 2015-10-02 NOTE — Clinical Social Work Note (Signed)
CSW received consult for "comfort care" CSW reviewed chart and spoke with RNCM. CSW is available for support if needed. CSW will continue to follow.   Darden Dates, MSW, LCSW Clinical Social Worker  [phone]

## 2015-10-02 NOTE — Progress Notes (Signed)
Patient's respirations have increased and PRN lorazepam given.

## 2015-10-02 NOTE — Progress Notes (Signed)
Death Pronouncement   Pt passed away peacefully with family in the room.  Nurses and I examined pt and found him to be without pulses or heartbeat or respirations or lung sounds.  His pupils were fixed and dilated.  He is pronounced dead at 4:03 pm today.  Death certificate to go to attending physician.    Jetty Duhamel. Megan Salon, MD

## 2015-10-02 NOTE — Progress Notes (Addendum)
Pt passed at 1603. Verified with Tamala Fothergill, RN. Also Dr. Megan Salon in the room. Dr. [name] aware & Dr. Megan Salon aware. Nursing supervisor aware. Chaplain aware. Family in room.  IVs removed. Foley removed. Funeral home picked up patient.

## 2015-10-02 NOTE — Progress Notes (Signed)
   10-13-2015 1739  Clinical Encounter Type  Visited With Family  Visit Type Death  Referral From Physician  Consult/Referral To Chaplain  Spiritual Encounters  Spiritual Needs Emotional;Grief support  Chaplain provided a compassionate presence for family during their time of loss.   [place] [phone]

## 2015-10-02 NOTE — Plan of Care (Signed)
Problem: Pain Management: Goal: Satisfaction with pain management regimen will improve Pt transferred from CCU, pt arrived in reclining chair and remains in chair per family request, on 6 L oxygen nasal canula, on morphine drip that has been titrated to to 6 mg/ hr. Family at bedside, bereavement cart delivered to room, palliative care consulted with family and discused option of transporting to hospice home and family does not  desire transportation at this time. Lorazepam given twice, pt is resting comfortable in recliner. Report given to Krista at 15:30

## 2015-10-02 DEATH — deceased

## 2015-10-19 ENCOUNTER — Ambulatory Visit: Payer: Medicare Other | Admitting: Radiation Oncology

## 2015-11-21 ENCOUNTER — Ambulatory Visit: Payer: Medicare Other

## 2015-11-21 ENCOUNTER — Other Ambulatory Visit: Payer: Medicare Other

## 2015-11-21 ENCOUNTER — Ambulatory Visit: Payer: Medicare Other | Admitting: Oncology

## 2016-03-21 ENCOUNTER — Other Ambulatory Visit: Payer: Self-pay | Admitting: Nurse Practitioner

## 2016-12-18 IMAGING — CR DG CHEST 2V
1 series · 2 of 2 positions shown · non-contrast
Comparison: 04/05/2015 and older studies dating back to 03/13/2013

CLINICAL DATA: Shortness of breath and urinary retention for 2
days. Pulmonary fibrosis. Congestive heart failure. COPD. Prostate
carcinoma.

EXAM:
CHEST  2 VIEW

[Series 1: dg chest 2 view · 0.14mm/px · 2 of 2 slices shown]
[im 1/2]
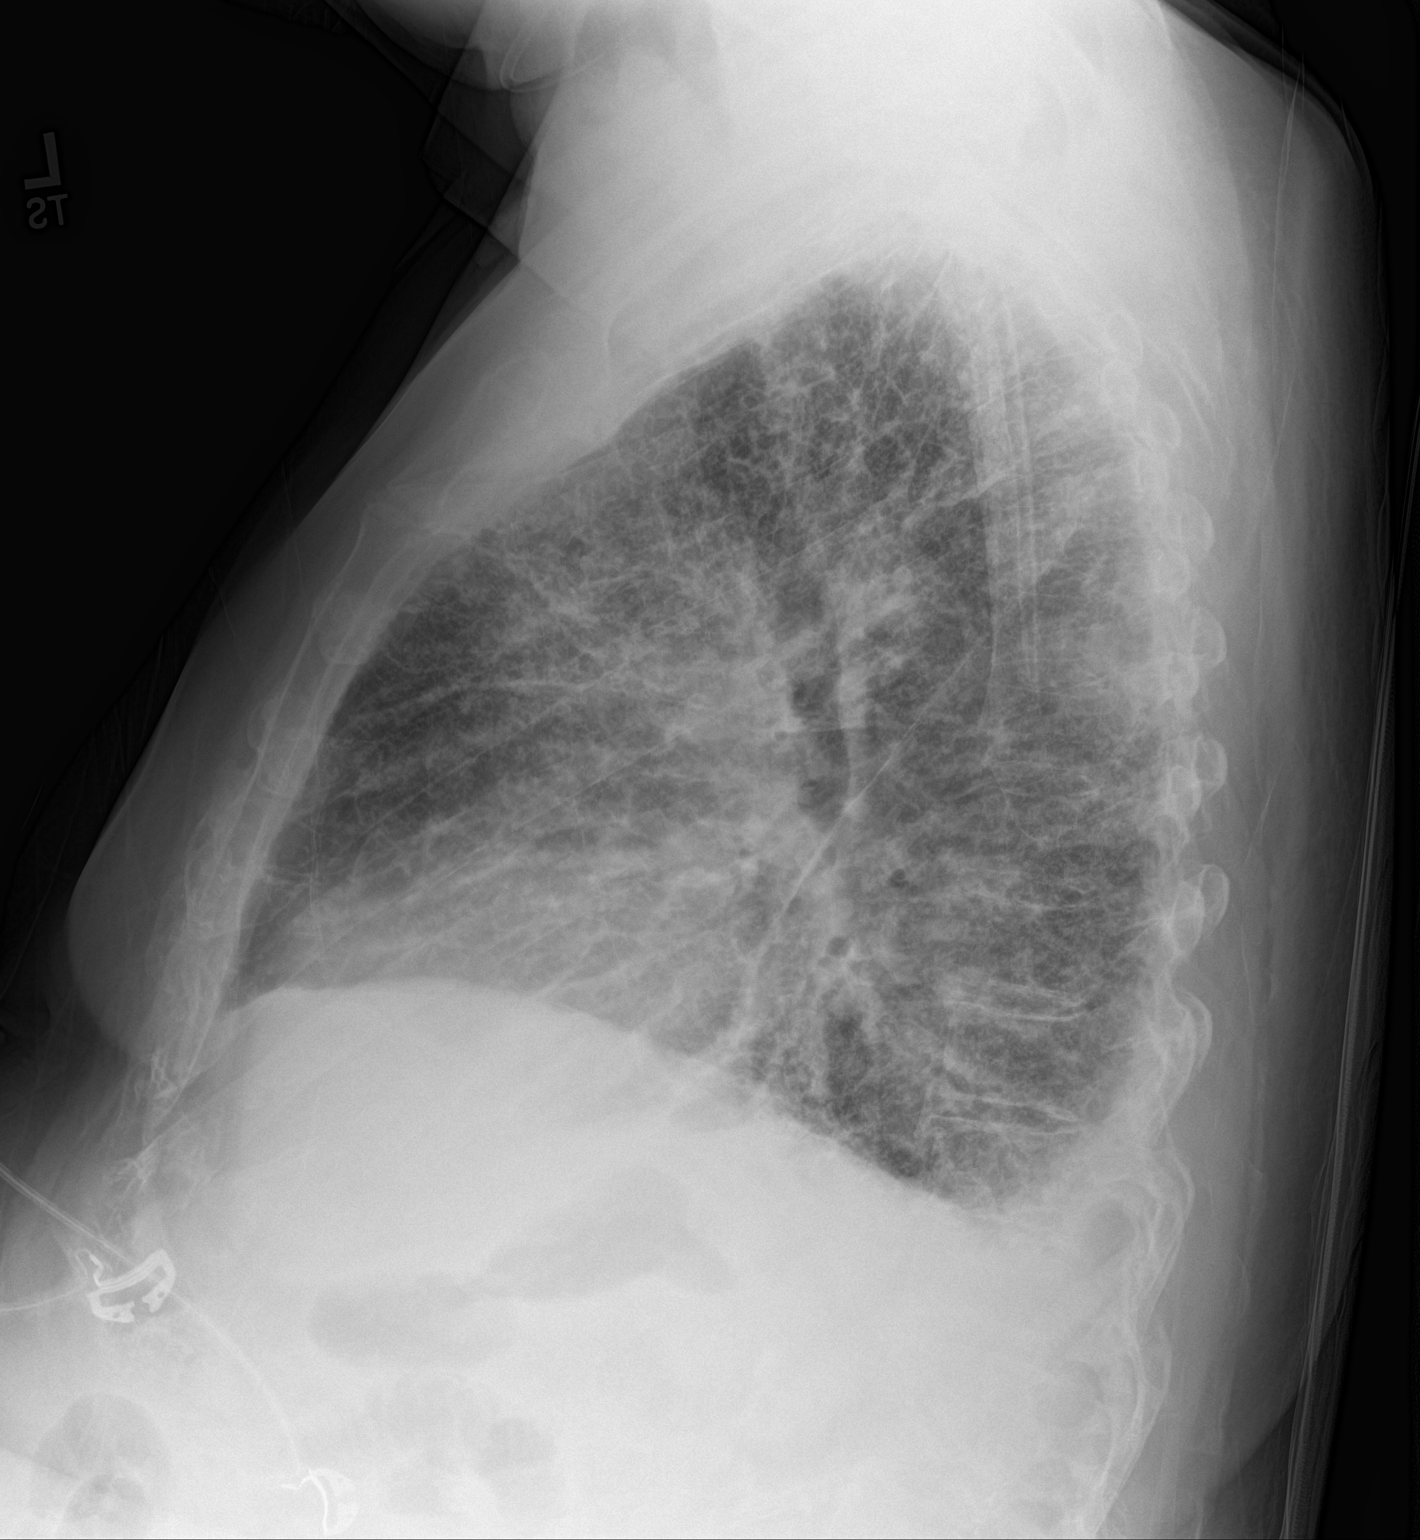
[im 2/2]
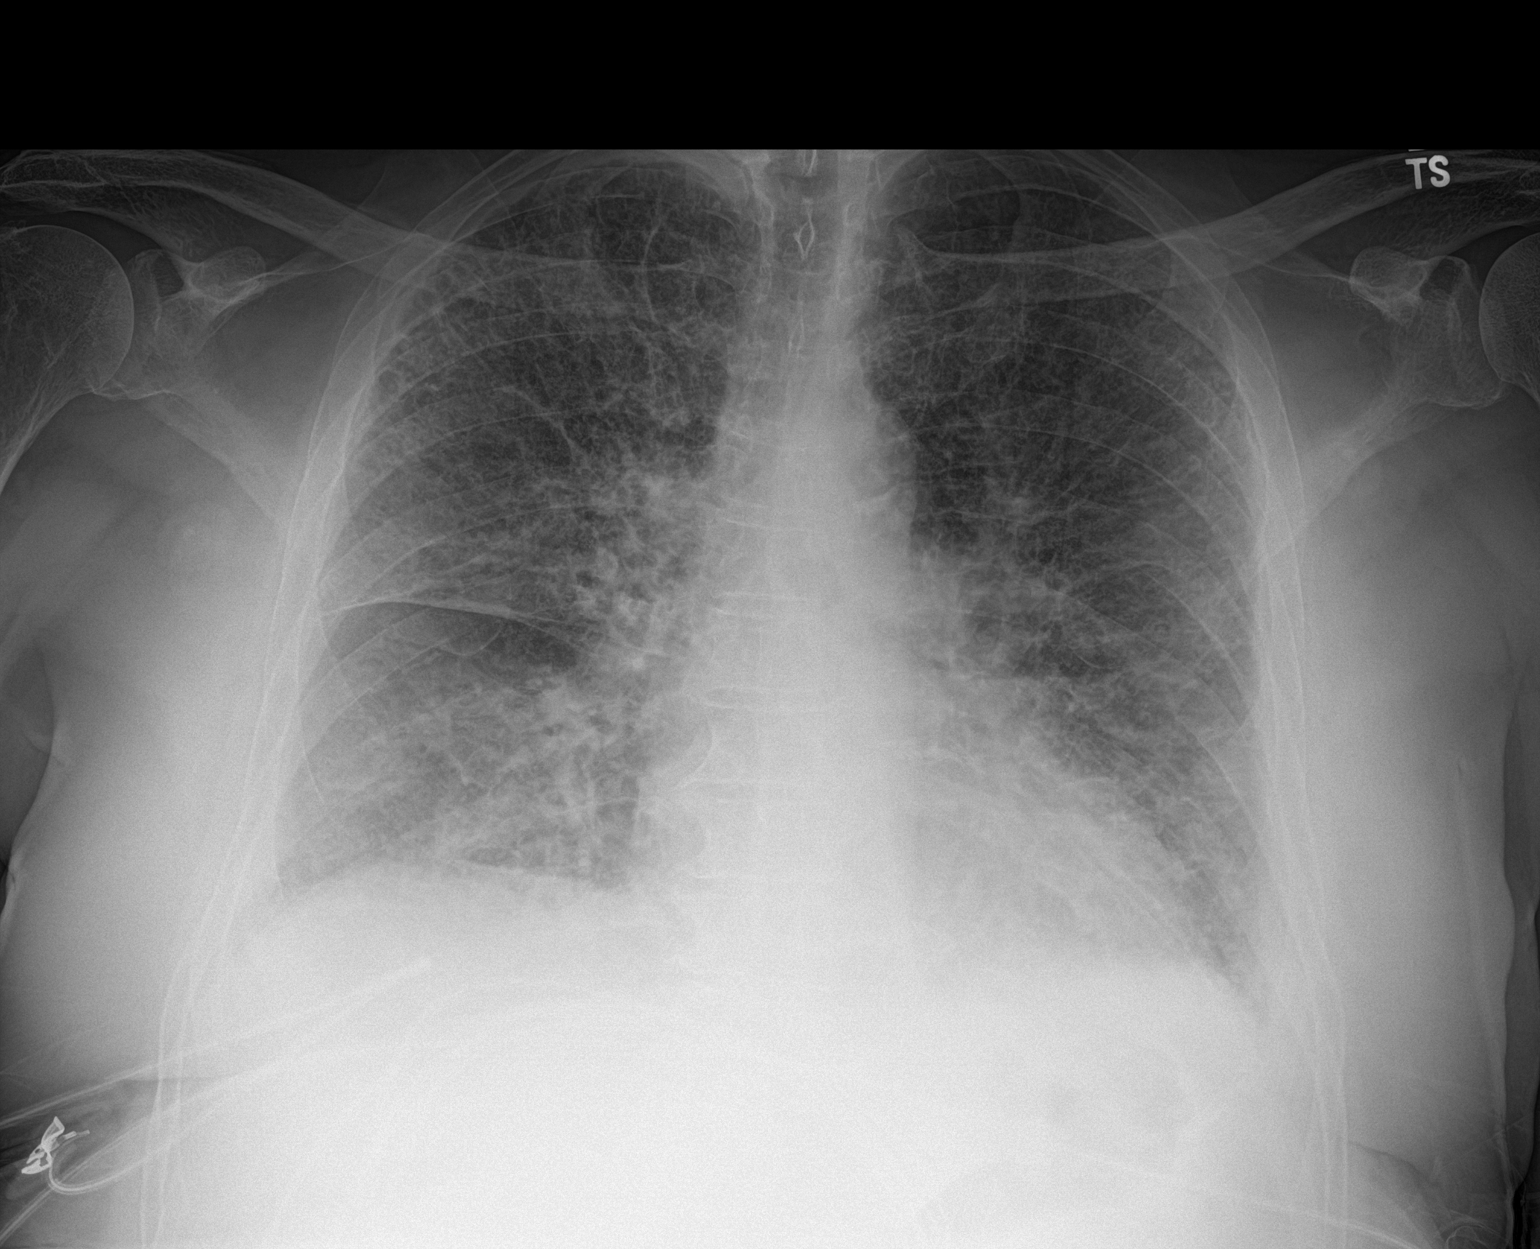

[2 of 2 positions shown; findings below may reference images not displayed]

FINDINGS: Heart size is within normal limits. Coarse interstitial infiltrates
are again seen bilaterally showing no significant change dating back
to 2884 exam, consistent with chronic pulmonary fibrosis. No
evidence of superimposed pulmonary consolidation. Mild left pleural
thickening also appears stable.
IMPRESSION: Chronic interstitial lung disease, consistent with known pulmonary
fibrosis. No acute or superimposed process identified.

## 2017-03-19 IMAGING — CR DG CHEST 1V PORT
1 series · 1 of 1 positions shown · non-contrast
Comparison: Same day.

CLINICAL DATA: Central line placement.

EXAM:
PORTABLE CHEST 1 VIEW

[ap]
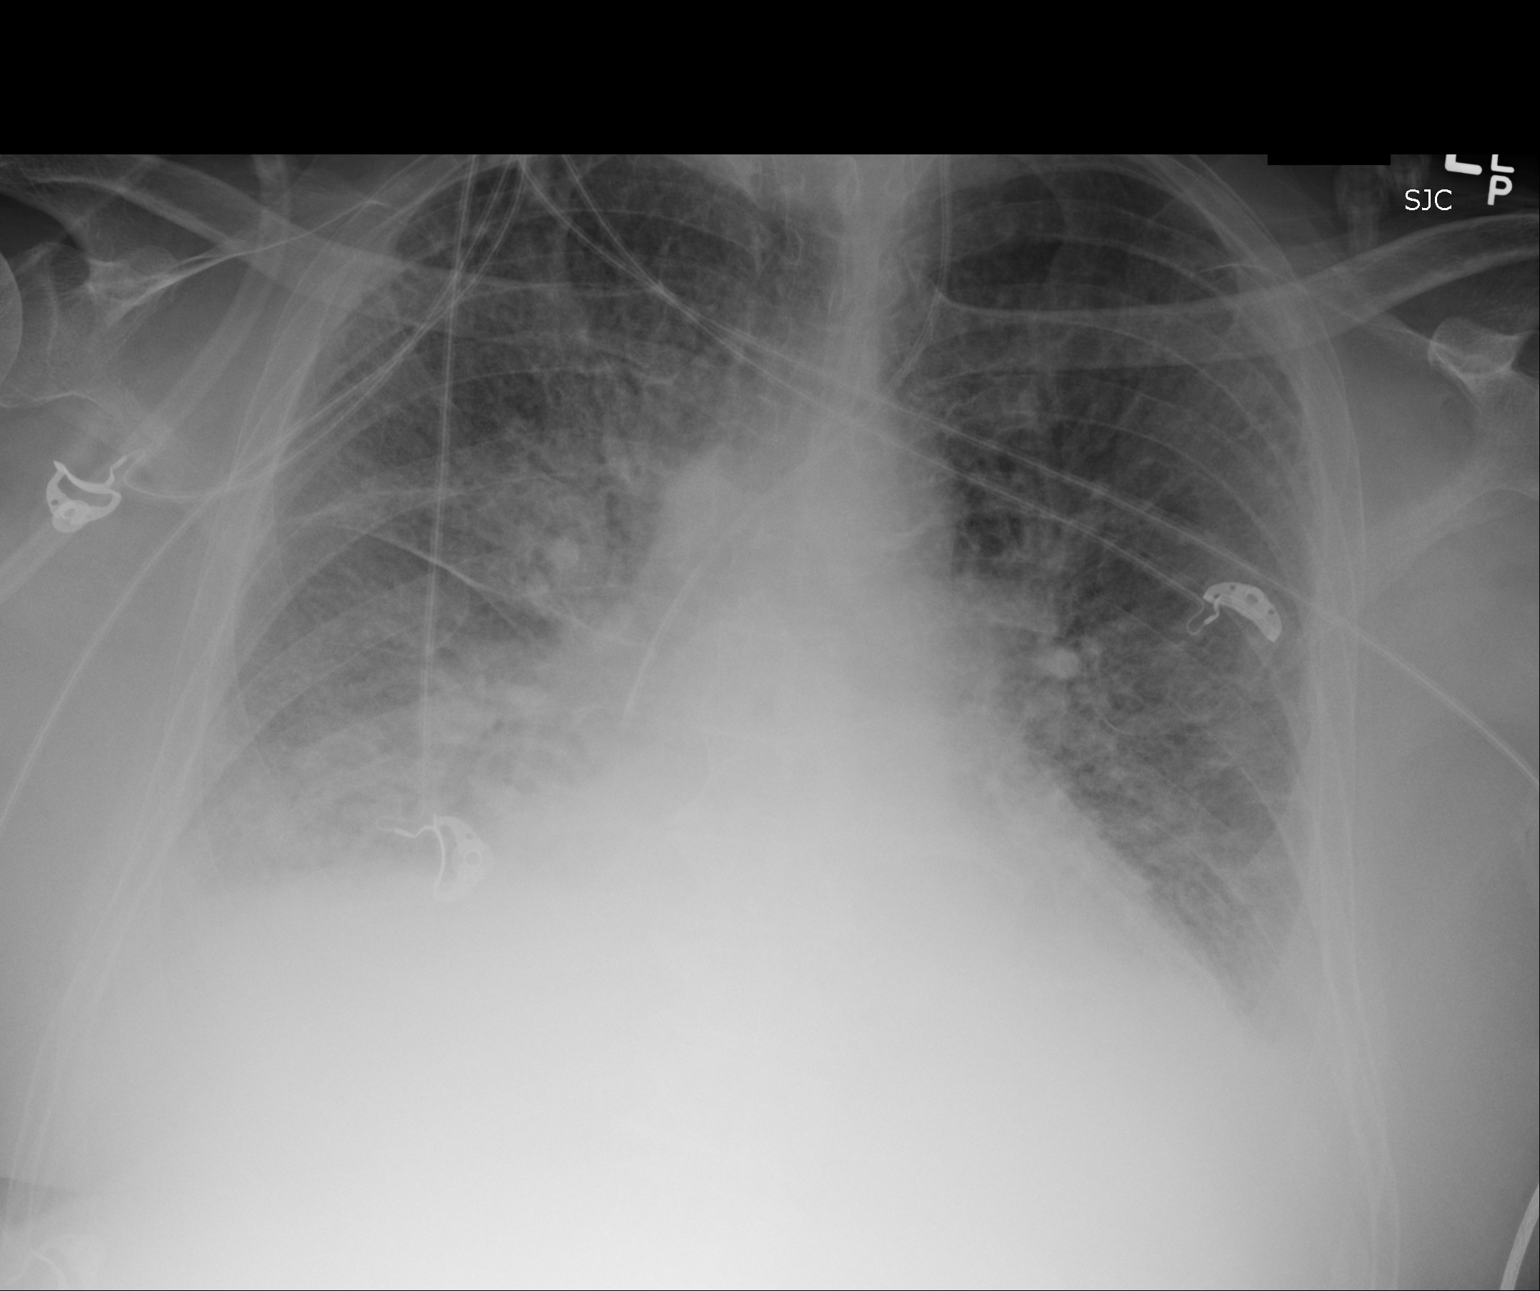

[1 of 1 positions shown; findings below may reference images not displayed]

FINDINGS: Stable cardiomegaly. Stable bilateral pulmonary edema is noted with
probable mild bilateral pleural effusions. No pneumothorax is noted.
Interval placement of left internal jugular catheter line with
distal tip in expected position of the cavoatrial junction. Bony
thorax is unremarkable.
IMPRESSION: Cardiomegaly and severe bilateral pulmonary edema with mild
associated pleural effusions is again noted consistent with
congestive heart failure. Interval placement of left internal
jugular catheter line with distal tip in expected position of
cavoatrial junction. No pneumothorax is noted.
